# Patient Record
Sex: Female | Born: 1960 | Race: Black or African American | Hispanic: No | Marital: Married | Smoking: Never smoker
Health system: Southern US, Community
[De-identification: ages and names within clinical notes are randomized; demographics above are authoritative.]

## PROBLEM LIST (undated history)

## (undated) ENCOUNTER — Ambulatory Visit: Admission: EM | Payer: BC Managed Care – PPO | Source: Home / Self Care

## (undated) DIAGNOSIS — I10 Essential (primary) hypertension: Secondary | ICD-10-CM

## (undated) DIAGNOSIS — E785 Hyperlipidemia, unspecified: Secondary | ICD-10-CM

## (undated) HISTORY — PX: ABDOMINAL HYSTERECTOMY: SHX81

## (undated) HISTORY — DX: Hyperlipidemia, unspecified: E78.5

---

## 2004-10-13 ENCOUNTER — Emergency Department: Payer: Self-pay | Admitting: Unknown Physician Specialty

## 2005-08-08 ENCOUNTER — Ambulatory Visit: Payer: Self-pay | Admitting: Internal Medicine

## 2005-08-29 ENCOUNTER — Ambulatory Visit: Payer: Self-pay | Admitting: Internal Medicine

## 2005-09-29 ENCOUNTER — Ambulatory Visit: Payer: Self-pay | Admitting: Internal Medicine

## 2005-10-27 ENCOUNTER — Ambulatory Visit: Payer: Self-pay | Admitting: Internal Medicine

## 2005-11-27 ENCOUNTER — Ambulatory Visit: Payer: Self-pay | Admitting: Internal Medicine

## 2008-06-23 ENCOUNTER — Ambulatory Visit: Payer: Self-pay | Admitting: Otolaryngology

## 2009-03-29 ENCOUNTER — Ambulatory Visit: Payer: Self-pay | Admitting: Internal Medicine

## 2009-04-13 ENCOUNTER — Ambulatory Visit: Payer: Self-pay | Admitting: Internal Medicine

## 2009-04-29 ENCOUNTER — Ambulatory Visit: Payer: Self-pay | Admitting: Internal Medicine

## 2011-04-30 ENCOUNTER — Emergency Department: Payer: Self-pay | Admitting: Emergency Medicine

## 2011-09-09 ENCOUNTER — Emergency Department: Payer: Self-pay | Admitting: Emergency Medicine

## 2019-02-24 ENCOUNTER — Emergency Department: Payer: No Typology Code available for payment source

## 2019-02-24 ENCOUNTER — Emergency Department
Admission: EM | Admit: 2019-02-24 | Discharge: 2019-02-24 | Disposition: A | Discharge status: Home / Self Care | Payer: No Typology Code available for payment source | Attending: Student in an Organized Health Care Education/Training Program | Admitting: Student in an Organized Health Care Education/Training Program

## 2019-02-24 ENCOUNTER — Other Ambulatory Visit: Payer: Self-pay

## 2019-02-24 DIAGNOSIS — M25462 Effusion, left knee: Secondary | ICD-10-CM

## 2019-02-24 DIAGNOSIS — M1712 Unilateral primary osteoarthritis, left knee: Secondary | ICD-10-CM | POA: Diagnosis not present

## 2019-02-24 DIAGNOSIS — M25562 Pain in left knee: Secondary | ICD-10-CM

## 2019-02-24 MED ORDER — HYDROCODONE-ACETAMINOPHEN 5-325 MG PO TABS
1.0000 | ORAL_TABLET | Freq: Once | ORAL | Status: AC
  Administered 2019-02-24: 1 via ORAL
  Filled 2019-02-24: qty 1

## 2019-02-24 MED ORDER — KETOROLAC TROMETHAMINE 10 MG PO TABS: 10.0000 mg | ORAL_TABLET | Freq: Four times a day (QID) | ORAL | 0 refills | Status: DC | PRN

## 2019-02-24 MED ORDER — KETOROLAC TROMETHAMINE 30 MG/ML IJ SOLN
30.0000 mg | Freq: Once | INTRAMUSCULAR | Status: AC
  Administered 2019-02-24: 30 mg via INTRAMUSCULAR
  Filled 2019-02-24: qty 1

## 2019-02-24 NOTE — ED Notes (Signed)
Pt to xray via wheelchair

## 2019-02-24 NOTE — Discharge Instructions (Signed)
Your x-ray does not show any broken bones.  It does show you may have some swelling inside the joint, which could mean that you injured one of the ligaments or the cartilage.  You also have some arthritis.  Please ice and elevate knee while at home.  Use knee brace or knee immobilizer and use crutches.  You can take Toradol for pain and inflammation.  Do not take any additional Advil with Toradol.  Please call Dr. Roland Rack on Monday for an appointment.

## 2019-02-24 NOTE — ED Notes (Signed)
Pt c/o L lower extremity pain worsening x 1 week, per triage note pt fell over 1 month ago, pt c/o worsening pain x 1 week. Pt ambulatory with steady gait from lobby to treatment room, NAD noted at this time.

## 2019-02-24 NOTE — ED Provider Notes (Signed)
[facility] Emergency Department Provider Note  ____________________________________________  Time seen: Approximately 7:48 AM  I have reviewed the triage vital signs and the nursing notes.   HISTORY  Chief Complaint Knee Pain    HPI Caitlin BattyCatherine Broberg is a 58 y.o. female that presents to the emergency department for evaluation of knee pain following knee injury 1 month ago.  Patient was at work when she fell landing on her left knee.  She has had pain on and off since then.  She has been wearing a brace, which helps some.  She is also been taking Advil, which does not help very much.  She was not evaluated post fall.  No additional injuries.  She knows she has arthritis in this knee as well.   No past medical history on file.  There are no active problems to display for this patient.   Prior to Admission medications   Medication Sig Start Date End Date Taking? Authorizing Provider  ketorolac (TORADOL) 10 MG tablet Take 1 tablet (10 mg total) by mouth every 6 (six) hours as needed. 02/24/19   Enid DerryWagner, Brendi Mccarroll, PA-C    Allergies Patient has no known allergies.  No family history on file.  Social History Social History   Tobacco Use  . Smoking status: Not on file  Substance Use Topics  . Alcohol use: Not on file  . Drug use: Not on file     Review of Systems  Constitutional: No fever/chills Gastrointestinal: No nausea, no vomiting.  Musculoskeletal: Positive for knee pain. Skin: Negative for rash, abrasions, lacerations, ecchymosis. Neurological: Negative for numbness or tingling   ____________________________________________   PHYSICAL EXAM:  VITAL SIGNS: ED Triage Vitals  Enc Vitals Group     BP 02/24/19 0345 130/82     Pulse Rate 02/24/19 0345 (!) 59     Resp 02/24/19 0345 17     Temp 02/24/19 0345 98 F (36.7 C)     Temp Source 02/24/19 0345 Oral     SpO2 02/24/19 0345 98 %     Weight --      Height 02/24/19 0342 5' 10.5" (1.791  m)     Head Circumference --      Peak Flow --      Pain Score 02/24/19 0342 8     Pain Loc --      Pain Edu? --      Excl. in GC? --      Constitutional: Alert and oriented. Well appearing and in no acute distress. Eyes: Conjunctivae are normal. PERRL. EOMI. Head: Atraumatic. ENT:      Ears:      Nose: No congestion/rhinnorhea.      Mouth/Throat: Mucous membranes are moist.  Neck: No stridor.  Cardiovascular: Normal rate, regular rhythm.  Good peripheral circulation. Respiratory: Normal respiratory effort without tachypnea or retractions. Lungs CTAB. Good air entry to the bases with no decreased or absent breath sounds. Musculoskeletal: Full range of motion to all extremities. No gross deformities appreciated.  Knee brace and in place and removed for exam.  Mild tenderness to palpation to anterior knee.  Full range of motion of knee with minimal pain.  No visible swelling or erythema. Neurologic:  Normal speech and language. No gross focal neurologic deficits are appreciated.  Skin:  Skin is warm, dry and intact. No rash noted. Psychiatric: Mood and affect are normal. Speech and behavior are normal. Patient exhibits appropriate insight and judgement.   ____________________________________________   LABS (all labs  ordered are listed, but only abnormal results are displayed)  Labs Reviewed - No data to display ____________________________________________  EKG   ____________________________________________  RADIOLOGY Robinette Haines, personally viewed and evaluated these images (plain radiographs) as part of my medical decision making, as well as reviewing the written report by the radiologist.  Dg Knee Complete 4 Views Left  Result Date: 02/24/2019 CLINICAL DATA:  Fall approximately 1 month ago. Increasing knee pain. EXAM: LEFT KNEE - COMPLETE 4+ VIEW COMPARISON:  None. FINDINGS: Osseous alignment is normal. No fracture line or displaced fracture fragment seen. No acute or  suspicious osseous lesion. Probable joint effusion within the suprapatellar bursa. Mild spurring at the medial joint line. Medial and lateral compartment joint spaces appear well preserved in height. No significant degenerative change at the patellofemoral compartment. IMPRESSION: 1. No osseous fracture or dislocation. 2. Probable joint effusion. 3. Minimal degenerative spurring at the medial joint line. Electronically Signed   By: Franki Cabot M.D.   On: 02/24/2019 04:31    ____________________________________________    PROCEDURES  Procedure(s) performed:    Procedures    Medications  ketorolac (TORADOL) 30 MG/ML injection 30 mg (has no administration in time range)  HYDROcodone-acetaminophen (NORCO/VICODIN) 5-325 MG per tablet 1 tablet (has no administration in time range)     ____________________________________________   INITIAL IMPRESSION / ASSESSMENT AND PLAN / ED COURSE  Pertinent labs & imaging results that were available during my care of the patient were reviewed by me and considered in my medical decision making (see chart for details).  Review of the [place] CSRS was performed in accordance of the [place] prior to dispensing any controlled drugs.    Patient presented to emergency department for evaluation of knee pain following the injury 1 month ago.  Vital signs and exam are reassuring.  X-ray consistent with possible joint effusion and osteoarthritis.  IM Toradol and Norco was given for pain.  Knee immobilizer was placed and crutches were given.  Patient will be discharged home with prescriptions for Toradol. Patient is to follow up with orthopedics as directed.  Referral was given to Dr. Roland Rack.  Patient is given ED precautions to return to the ED for any worsening or new symptoms.  Caitlin Day was evaluated in Emergency Department on 02/24/2019 for the symptoms described in the history of present illness. She was evaluated in the context of the global COVID-19 pandemic,  which necessitated consideration that the patient might be at risk for infection with the SARS-CoV-2 virus that causes COVID-19. Institutional protocols and algorithms that pertain to the evaluation of patients at risk for COVID-19 are in a state of rapid change based on information released by regulatory bodies including the CDC and federal and state organizations. These policies and algorithms were followed during the patient's care in the ED.     ____________________________________________  FINAL CLINICAL IMPRESSION(S) / ED DIAGNOSES  Final diagnoses:  Effusion of left knee  Osteoarthritis of left knee, unspecified osteoarthritis type  Acute pain of left knee      NEW MEDICATIONS STARTED DURING THIS VISIT:  ED Discharge Orders         Ordered    ketorolac (TORADOL) 10 MG tablet  Every 6 hours PRN     02/24/19 0752              This chart was dictated using voice recognition software/Dragon. Despite best efforts to proofread, errors can occur which can change the meaning. Any change was purely unintentional.  Enid DerryWagner, Jearldean Gutt, PA-C 02/24/19 1541    Willy Eddyobinson, Patrick, MD 02/24/19 1600

## 2019-02-24 NOTE — ED Notes (Signed)
Pt requesting to file [facility] for fall at work. Pt works for [employer]. Pt states initial injury happened on December 19, 2018. No WC profile found by this RN.

## 2019-02-24 NOTE — ED Notes (Signed)
NAD noted at time of D/C. Pt denies questions or concerns. Pt ambulatory to the lobby at this time.  Pt refused wheelchair to the lobby at this time.  

## 2019-02-24 NOTE — ED Notes (Signed)
Attempted to contact supervisor multiple times prior to administration of medication, called [phone] from card patient provided, called [phone] from website without success. Conferred with Theadora Rama, RN and Levada Dy, RN, no UDS done since accident happened 4/22 and unable to get in contact with supervisors.

## 2019-02-24 NOTE — ED Triage Notes (Signed)
Patient reports fell approximately a month ago but did not seek medical treatment.  Over past few days left knee pain that is getting worse.

## 2019-02-28 ENCOUNTER — Ambulatory Visit
Admission: EM | Admit: 2019-02-28 | Discharge: 2019-02-28 | Disposition: A | Discharge status: Home / Self Care | Payer: No Typology Code available for payment source | Attending: Family Medicine | Admitting: Family Medicine

## 2019-02-28 ENCOUNTER — Other Ambulatory Visit: Payer: Self-pay

## 2019-02-28 DIAGNOSIS — G8929 Other chronic pain: Secondary | ICD-10-CM | POA: Diagnosis not present

## 2019-02-28 DIAGNOSIS — M25562 Pain in left knee: Secondary | ICD-10-CM | POA: Diagnosis not present

## 2019-02-28 HISTORY — DX: Essential (primary) hypertension: I10

## 2019-02-28 MED ORDER — TRAMADOL HCL 50 MG PO TABS: 50.0000 mg | ORAL_TABLET | Freq: Three times a day (TID) | ORAL | 0 refills | Status: DC | PRN

## 2019-02-28 MED ORDER — ONDANSETRON 4 MG PO TBDP: 4.0000 mg | ORAL_TABLET | Freq: Once | ORAL | Status: DC

## 2019-02-28 NOTE — Discharge Instructions (Signed)
Rest, ice, elevation.  Medication as needed.  If persists, see [facility] ([address], [place], [place])

## 2019-02-28 NOTE — ED Triage Notes (Signed)
Pt here for fall that happened 2 months ago but didn't see a provider. Stated it got better and then got worse. Has been trying a heating pad. Landed on her left side and now left knee pain and has a brace on her left knee currently. Did go to the ER and was given a shot and some medication that did help.

## 2019-02-28 NOTE — ED Provider Notes (Signed)
[facility]    CSN: [id number] Arrival date & time: 02/28/19  3267  History   Chief Complaint Chief Complaint  Patient presents with  . Work Related Injury    Appt   HPI  58 year old female presents with the above complaint.  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  Patient states that she was injured on the job approximately 2 months ago.  She states that the injury occurred at the end of April.  Patient states that she fell on a piece of loose carpet.  She landed left knee.  Patient did not seek treatment immediately.  Patient was recently seen on 6/28 in the ER.  X-ray was obtained and was negative for fracture.  There was a mild effusion.  Mild osteoarthritis.  Patient was given medication and states that it has not improved her pain.  Pain is currently 7/10 in severity.  She has tried over-the-counter ibuprofen, prescription Toradol, Advil, topical medication without resolution.  Exacerbated by prolonged standing.  No relieving factors.  No other associated symptoms.  No other complaints.  PMH, Surgical Hx, Family Hx, Social History reviewed and updated as below.  Past Medical History:  Diagnosis Date  . Hypertension   Obesity Hypothyroidism Gout Hyperlipidemia  Past Surgical History:  Procedure Laterality Date  . ABDOMINAL HYSTERECTOMY     OB History   No obstetric history on file.    Home Medications    Prior to Admission medications   Medication Sig Start Date End Date Taking? Authorizing Provider  allopurinol (ZYLOPRIM) 100 MG tablet Take 100 mg by mouth daily. 12/21/18  Yes [provider]  estradiol (ESTRACE) 0.5 MG tablet Take 0.5 mg by mouth daily. 01/22/19  Yes [provider]  lisinopril-hydrochlorothiazide (ZESTORETIC) 20-25 MG tablet Take 1 tablet by mouth daily. 12/21/18  Yes [provider]  pravastatin (PRAVACHOL) 40 MG tablet Take by mouth. 06/04/18  Yes [provider]  SYNTHROID 150 MCG tablet Take 150 mcg by  mouth every morning. 02/05/19  Yes [provider]  traMADol (ULTRAM) 50 MG tablet Take 1 tablet (50 mg total) by mouth every 8 (eight) hours as needed. 02/28/19   Coral Spikes, DO    Family History Family History  Problem Relation Age of Onset  . Dementia Father     Social History Social History   Tobacco Use  . Smoking status: Never Smoker  . Smokeless tobacco: Never Used  Substance Use Topics  . Alcohol use: Never    Frequency: Never  . Drug use: Not on file     Allergies   Patient has no known allergies.   Review of Systems Review of Systems  Constitutional: Negative.   Musculoskeletal:       Left knee pain.   Physical Exam Triage Vital Signs ED Triage Vitals  Enc Vitals Group     BP 02/28/19 1006 (!) 135/92     Pulse Rate 02/28/19 1006 66     Resp 02/28/19 1006 18     Temp 02/28/19 1006 98.1 F (36.7 C)     Temp Source 02/28/19 1006 Oral     SpO2 02/28/19 1006 97 %     Weight 02/28/19 1007 230 lb (104.3 kg)     Height 02/28/19 1007 5' 10.5" (1.791 m)     Head Circumference --      Peak Flow --      Pain Score 02/28/19 1007 7     Pain Loc --  Pain Edu? --      Excl. in GC? --    Updated Vital Signs BP (!) 135/92 (BP Location: Right Arm)   Pulse 66   Temp 98.1 F (36.7 C) (Oral)   Resp 18   Ht 5' 10.5" (1.791 m)   Wt 104.3 kg   SpO2 97%   BMI 32.54 kg/m   Visual Acuity Right Eye Distance:   Left Eye Distance:   Bilateral Distance:    Right Eye Near:   Left Eye Near:    Bilateral Near:     Physical Exam Vitals signs and nursing note reviewed.  Constitutional:      General: She is not in acute distress.    Appearance: Normal appearance. She is obese.  HENT:     Head: Normocephalic and atraumatic.  Eyes:     General:        Right eye: No discharge.        Left eye: No discharge.     Conjunctiva/sclera: Conjunctivae normal.  Pulmonary:     Effort: Pulmonary effort is normal. No respiratory distress.  Musculoskeletal:      Comments: Left knee -normal inspection.  No joint line tenderness.  Ligaments intact.  Skin:    General: Skin is warm.     Findings: No bruising.  Neurological:     Mental Status: She is alert.  Psychiatric:        Mood and Affect: Mood normal.        Behavior: Behavior normal.    UC Treatments / Results  Labs (all labs ordered are listed, but only abnormal results are displayed) Labs Reviewed - No data to display  EKG   Radiology No results found.  Procedures Procedures (including critical care time)  Medications Ordered in UC Medications - No data to display  Initial Impression / Assessment and Plan / UC Course  I have reviewed the triage vital signs and the nursing notes.  Pertinent labs & imaging results that were available during my care of the patient were reviewed by me and considered in my medical decision making (see chart for details).    58 year old female presents with chronic pain in her left knee after suffering a fall at work.  Workmen's Comp. form filled out.  Work note given.  Tramadol as needed.  Advised to see orthopedics.  Final Clinical Impressions(s) / UC Diagnoses   Final diagnoses:  Chronic pain of left knee     Discharge Instructions     Rest, ice, elevation.  Medication as needed.  If persists, see [facility] ([address], [place], [place])   ED Prescriptions    Medication Sig Dispense Auth. Provider   traMADol (ULTRAM) 50 MG tablet Take 1 tablet (50 mg total) by mouth every 8 (eight) hours as needed. 15 tablet Tommie Samsook, Rayn Enderson G, DO     Controlled Substance Prescriptions [place] Controlled Substance Registry consulted? Not Applicable   Tommie SamsCook, Kellan Boehlke G, DO 02/28/19 1241

## 2021-04-06 ENCOUNTER — Encounter: Payer: Self-pay | Admitting: Endocrinology

## 2021-10-11 DIAGNOSIS — E7800 Pure hypercholesterolemia, unspecified: Secondary | ICD-10-CM | POA: Diagnosis not present

## 2021-10-11 DIAGNOSIS — E790 Hyperuricemia without signs of inflammatory arthritis and tophaceous disease: Secondary | ICD-10-CM | POA: Diagnosis not present

## 2021-10-11 DIAGNOSIS — E039 Hypothyroidism, unspecified: Secondary | ICD-10-CM | POA: Diagnosis not present

## 2021-10-11 DIAGNOSIS — E063 Autoimmune thyroiditis: Secondary | ICD-10-CM | POA: Diagnosis not present

## 2021-10-11 DIAGNOSIS — R7302 Impaired glucose tolerance (oral): Secondary | ICD-10-CM | POA: Diagnosis not present

## 2021-10-18 DIAGNOSIS — E669 Obesity, unspecified: Secondary | ICD-10-CM | POA: Diagnosis not present

## 2021-10-18 DIAGNOSIS — E559 Vitamin D deficiency, unspecified: Secondary | ICD-10-CM | POA: Diagnosis not present

## 2021-10-18 DIAGNOSIS — I10 Essential (primary) hypertension: Secondary | ICD-10-CM | POA: Diagnosis not present

## 2021-10-18 DIAGNOSIS — E7800 Pure hypercholesterolemia, unspecified: Secondary | ICD-10-CM | POA: Diagnosis not present

## 2021-10-18 DIAGNOSIS — K760 Fatty (change of) liver, not elsewhere classified: Secondary | ICD-10-CM | POA: Diagnosis not present

## 2021-10-18 DIAGNOSIS — Z6833 Body mass index (BMI) 33.0-33.9, adult: Secondary | ICD-10-CM | POA: Diagnosis not present

## 2021-10-18 DIAGNOSIS — Z7189 Other specified counseling: Secondary | ICD-10-CM | POA: Diagnosis not present

## 2021-10-18 DIAGNOSIS — E039 Hypothyroidism, unspecified: Secondary | ICD-10-CM | POA: Diagnosis not present

## 2021-11-27 ENCOUNTER — Other Ambulatory Visit: Payer: Self-pay

## 2021-11-27 ENCOUNTER — Emergency Department
Admission: EM | Admit: 2021-11-27 | Discharge: 2021-11-27 | Disposition: A | Discharge status: Home / Self Care | Payer: No Typology Code available for payment source | Attending: Emergency Medicine | Admitting: Emergency Medicine

## 2021-11-27 ENCOUNTER — Encounter: Payer: Self-pay | Admitting: Intensive Care

## 2021-11-27 DIAGNOSIS — M25561 Pain in right knee: Secondary | ICD-10-CM | POA: Insufficient documentation

## 2021-11-27 DIAGNOSIS — W010XXA Fall on same level from slipping, tripping and stumbling without subsequent striking against object, initial encounter: Secondary | ICD-10-CM | POA: Insufficient documentation

## 2021-11-27 NOTE — ED Triage Notes (Signed)
Patient reports when at work yesterday she fell her on right knee. Now experiencing right knee discomfort. Patient wants to file workmans comp ?

## 2021-11-27 NOTE — Discharge Instructions (Addendum)
Please use ibuprofen (Motrin) up to 800 mg every 8 hours, naproxen (Naprosyn) up to 500 mg every 12 hours, and/or acetaminophen (Tylenol) up to 4 g/day for any continued pain 

## 2021-11-27 NOTE — ED Provider Notes (Signed)
? ?[facility] ?Provider Note ? ? Event Date/Time  ? First MD Initiated Contact with Patient 11/27/21 1657   ?  (approximate) ?History  ?Fall ? ?HPI ?Caitlin Day is a 61 y.o. female with a history of obesity who presents for right anterior knee pain after a mechanical fall from standing 24 hours prior to arrival.  Patient states that she was pulled forward by her vest and landed on her hands and knees on a tile floor.  Patient states that she has been able to bear weight and walk since this time but does endorse significant anterior pain just behind her kneecap.  Patient states that she can bend it to a full range of motion with limited pain and endorses occasional shooting pains down the anterior shin. ?Physical Exam  ?Triage Vital Signs: ?ED Triage Vitals  ?Enc Vitals Group  ?   BP 11/27/21 1651 134/85  ?   Pulse Rate 11/27/21 1651 83  ?   Resp 11/27/21 1651 18  ?   Temp 11/27/21 1651 98.6 ?F (37 ?C)  ?   Temp Source 11/27/21 1651 Oral  ?   SpO2 11/27/21 1651 97 %  ?   Weight 11/27/21 1651 230 lb (104.3 kg)  ?   Height 11/27/21 1651 5' 10.5" (1.791 m)  ?   Head Circumference --   ?   Peak Flow --   ?   Pain Score 11/27/21 1651 8  ?   Pain Loc --   ?   Pain Edu? --   ?   Excl. in GC? --   ? ?Most recent vital signs: ?Vitals:  ? 11/27/21 1651  ?BP: 134/85  ?Pulse: 83  ?Resp: 18  ?Temp: 98.6 ?F (37 ?C)  ?SpO2: 97%  ? ?General: Awake, oriented x4. ?CV:  Good peripheral perfusion.  ?Resp:  Normal effort.  ?Abd:  No distention.  ?Other:  Middle-aged overweight African-American female laying in bed in no distress.  Knee brace to the right knee with full range of motion without difficulty but with slight pain.  No joint laxity on exam.  Sensation intact ?ED Results / Procedures / Treatments  ? ?Procedures ?MEDICATIONS ORDERED IN ED: ?Medications - No data to display ?IMPRESSION / MDM / ASSESSMENT AND PLAN / ED COURSE  ?I reviewed the triage vital signs and the nursing notes. ?             ?                ? ?61 year old female presents for right knee pain after a fall 1 day ago.  Patient is ambulatory and can range this knee to the full range of motion with only minimal pain and therefore does not require any imaging at this time.  Patient encouraged and given proper follow-up with orthopedics as necessary if pain does not improve within the next 1 week. ?Given history, exam and workup I have low suspicion for fracture, dislocation, significant ligamentous injury, septic arthritis, gout flare, new autoimmune arthropathy, or gonococcal arthropathy. ? ?Interventions: ?As needed OTC analgesia ?Disposition: Discharge home with strict return precautions and instructions for prompt primary care follow up in the next week. ? ?  ?FINAL CLINICAL IMPRESSION(S) / ED DIAGNOSES  ? ?Final diagnoses:  ?Acute pain of right knee  ? ?Rx / DC Orders  ? ?ED Discharge Orders   ? ? None  ? ?  ? ?Note:  This document was prepared using Conservation officer, historic buildings and may  include unintentional dictation errors. ?  ?Merwyn Katos, MD ?11/27/21 1751 ? ?

## 2021-11-29 ENCOUNTER — Other Ambulatory Visit: Payer: Self-pay | Admitting: Physician Assistant

## 2021-11-29 ENCOUNTER — Ambulatory Visit
Admission: RE | Admit: 2021-11-29 | Discharge: 2021-11-29 | Disposition: A | Discharge status: Home / Self Care | Payer: No Typology Code available for payment source | Source: Ambulatory Visit | Attending: Physician Assistant | Admitting: Physician Assistant

## 2021-11-29 ENCOUNTER — Other Ambulatory Visit: Payer: Self-pay

## 2021-11-29 DIAGNOSIS — M1711 Unilateral primary osteoarthritis, right knee: Secondary | ICD-10-CM | POA: Insufficient documentation

## 2021-11-29 DIAGNOSIS — M25561 Pain in right knee: Secondary | ICD-10-CM | POA: Diagnosis not present

## 2021-11-29 DIAGNOSIS — Y990 Civilian activity done for income or pay: Secondary | ICD-10-CM | POA: Insufficient documentation

## 2021-11-29 DIAGNOSIS — W1830XA Fall on same level, unspecified, initial encounter: Secondary | ICD-10-CM | POA: Insufficient documentation

## 2021-11-29 DIAGNOSIS — S8991XA Unspecified injury of right lower leg, initial encounter: Secondary | ICD-10-CM | POA: Diagnosis present

## 2022-01-11 DIAGNOSIS — E063 Autoimmune thyroiditis: Secondary | ICD-10-CM | POA: Diagnosis not present

## 2022-01-11 DIAGNOSIS — E7800 Pure hypercholesterolemia, unspecified: Secondary | ICD-10-CM | POA: Diagnosis not present

## 2022-01-11 DIAGNOSIS — E039 Hypothyroidism, unspecified: Secondary | ICD-10-CM | POA: Diagnosis not present

## 2022-01-11 DIAGNOSIS — I10 Essential (primary) hypertension: Secondary | ICD-10-CM | POA: Diagnosis not present

## 2022-01-11 DIAGNOSIS — R7302 Impaired glucose tolerance (oral): Secondary | ICD-10-CM | POA: Diagnosis not present

## 2022-01-18 DIAGNOSIS — E7800 Pure hypercholesterolemia, unspecified: Secondary | ICD-10-CM | POA: Diagnosis not present

## 2022-01-18 DIAGNOSIS — I10 Essential (primary) hypertension: Secondary | ICD-10-CM | POA: Diagnosis not present

## 2022-01-18 DIAGNOSIS — Z6834 Body mass index (BMI) 34.0-34.9, adult: Secondary | ICD-10-CM | POA: Diagnosis not present

## 2022-01-18 DIAGNOSIS — E669 Obesity, unspecified: Secondary | ICD-10-CM | POA: Diagnosis not present

## 2022-01-18 DIAGNOSIS — E039 Hypothyroidism, unspecified: Secondary | ICD-10-CM | POA: Diagnosis not present

## 2022-04-04 DIAGNOSIS — E7800 Pure hypercholesterolemia, unspecified: Secondary | ICD-10-CM | POA: Diagnosis not present

## 2022-04-04 DIAGNOSIS — I10 Essential (primary) hypertension: Secondary | ICD-10-CM | POA: Diagnosis not present

## 2022-04-04 DIAGNOSIS — Z6834 Body mass index (BMI) 34.0-34.9, adult: Secondary | ICD-10-CM | POA: Diagnosis not present

## 2022-04-04 DIAGNOSIS — Z1331 Encounter for screening for depression: Secondary | ICD-10-CM | POA: Diagnosis not present

## 2022-04-04 DIAGNOSIS — E790 Hyperuricemia without signs of inflammatory arthritis and tophaceous disease: Secondary | ICD-10-CM | POA: Diagnosis not present

## 2022-04-04 DIAGNOSIS — Z136 Encounter for screening for cardiovascular disorders: Secondary | ICD-10-CM | POA: Diagnosis not present

## 2022-04-04 DIAGNOSIS — Z23 Encounter for immunization: Secondary | ICD-10-CM | POA: Diagnosis not present

## 2022-04-04 DIAGNOSIS — Z0001 Encounter for general adult medical examination with abnormal findings: Secondary | ICD-10-CM | POA: Diagnosis not present

## 2022-04-22 DIAGNOSIS — Z23 Encounter for immunization: Secondary | ICD-10-CM | POA: Diagnosis not present

## 2022-06-13 DIAGNOSIS — Z1231 Encounter for screening mammogram for malignant neoplasm of breast: Secondary | ICD-10-CM | POA: Diagnosis not present

## 2022-06-13 LAB — HM MAMMOGRAPHY

## 2022-07-20 IMAGING — CR DG KNEE COMPLETE 4+V*R*
4 series · 4 of 4 positions shown · non-contrast
Comparison: None.

CLINICAL DATA: Right knee pain

EXAM:
RIGHT KNEE - COMPLETE 4+ VIEW

[knee ap]
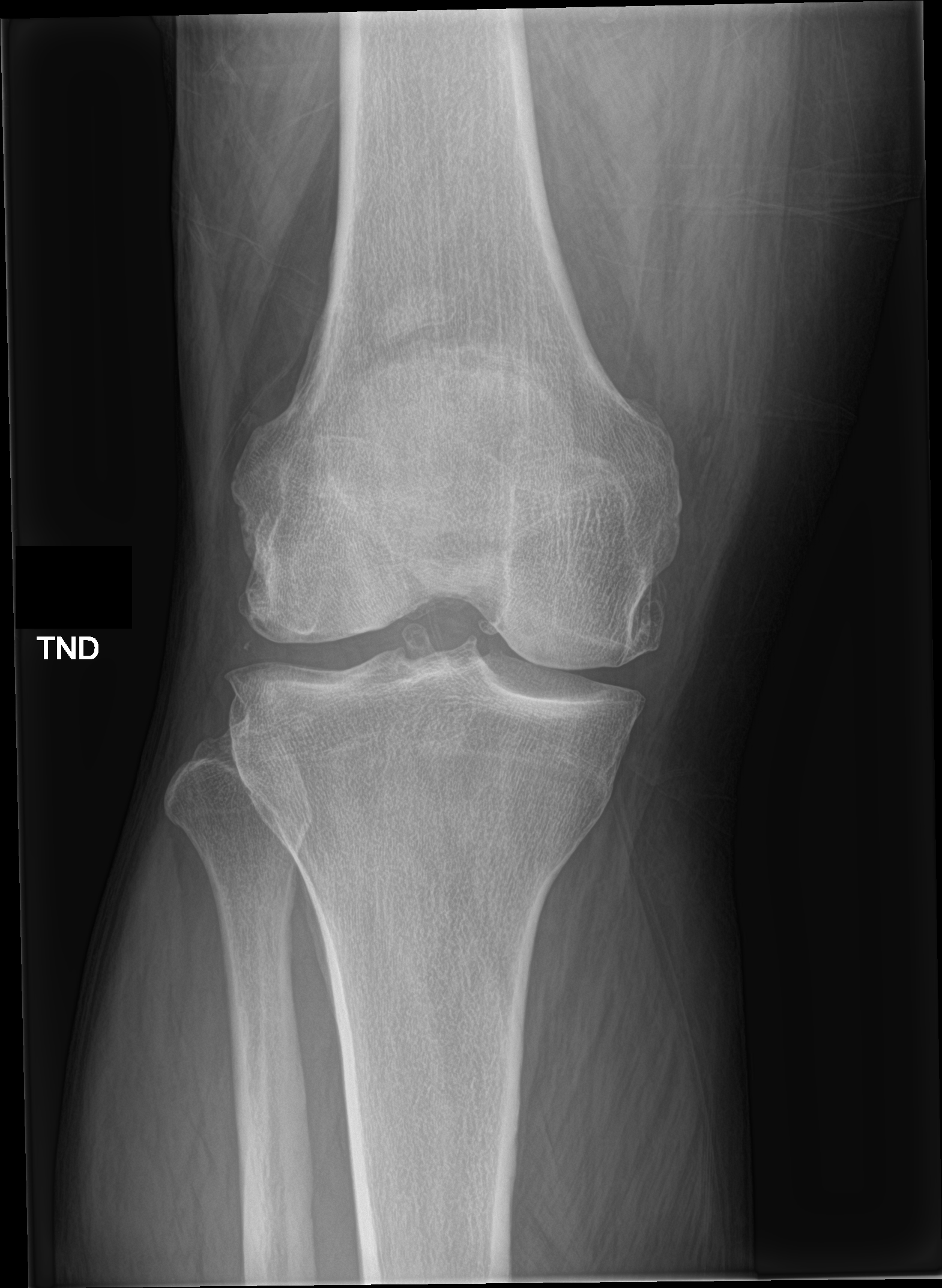

[knee obl (1 of 2)]
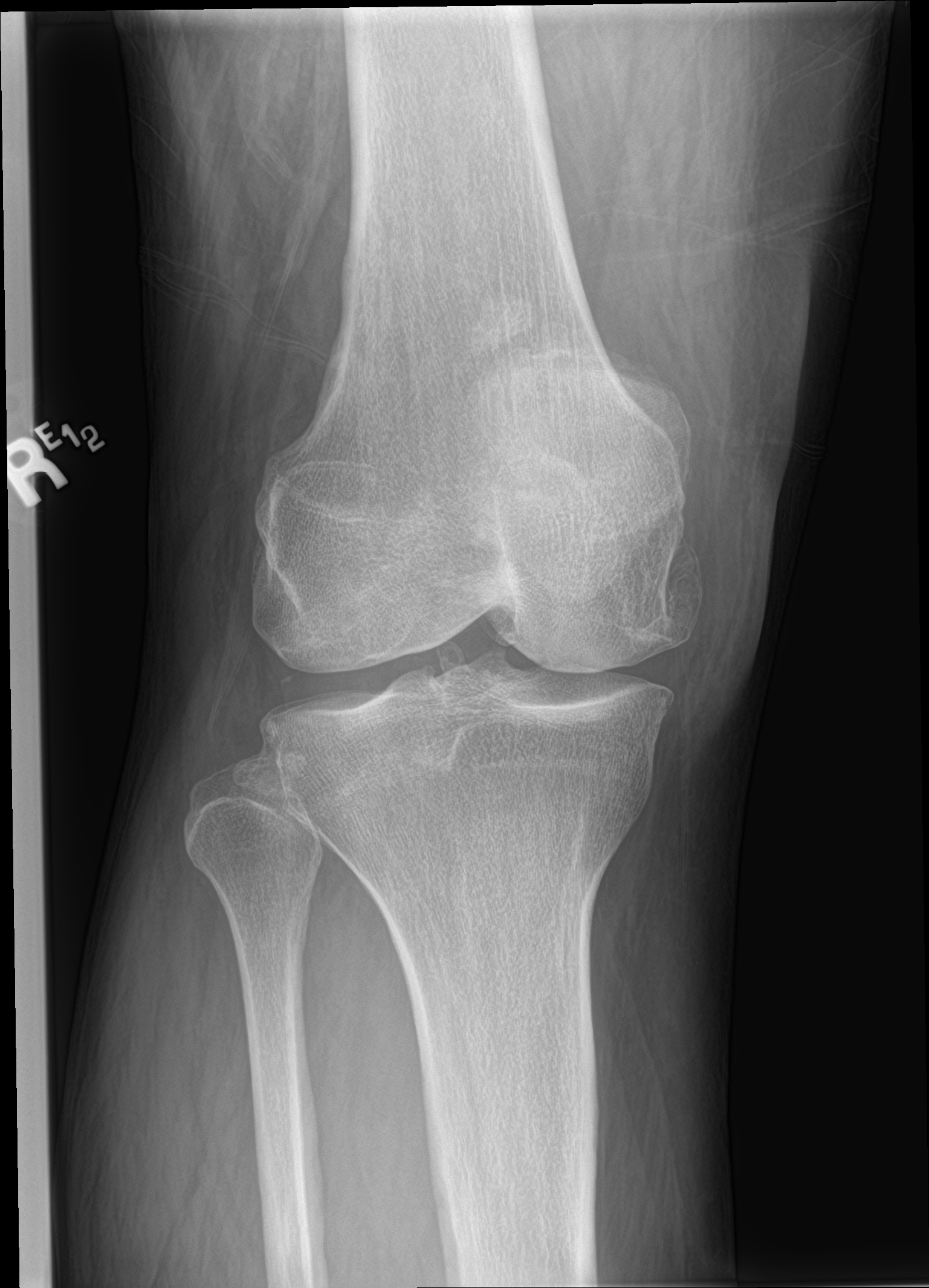

[knee obl (2 of 2)]
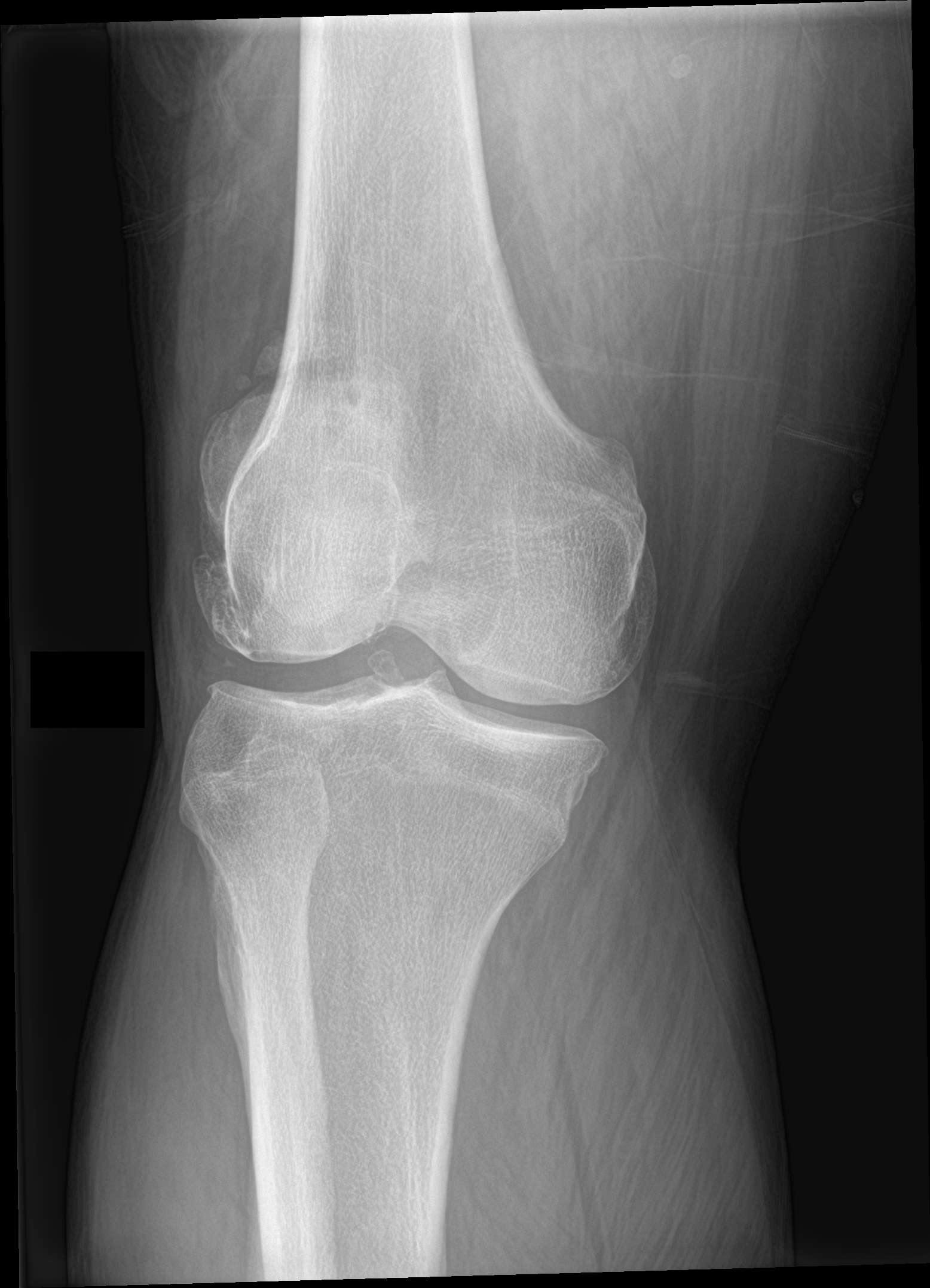

[knee lat]
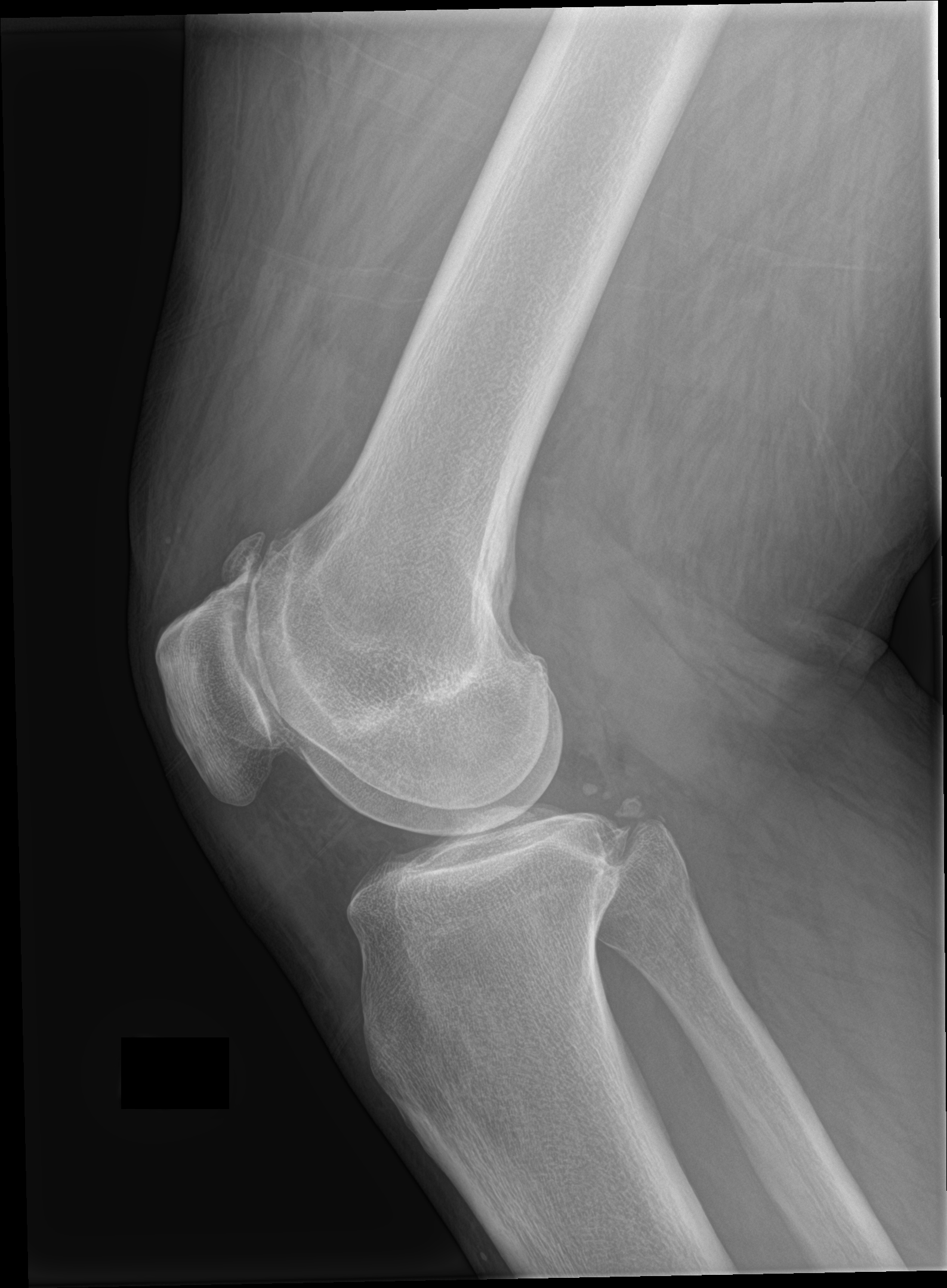

[4 of 4 positions shown; findings below may reference images not displayed]

FINDINGS: No acute fracture or malalignment. No large joint effusion.
Mild-moderate tricompartmental osteoarthritis which appears most
pronounced in the patellofemoral compartment. Small loose bodies at
the posterior joint line. No soft tissue abnormality.
IMPRESSION: Mild-moderate tricompartmental osteoarthritis, most pronounced in
the patellofemoral compartment.

## 2022-07-25 DIAGNOSIS — E063 Autoimmune thyroiditis: Secondary | ICD-10-CM | POA: Diagnosis not present

## 2022-07-25 DIAGNOSIS — I10 Essential (primary) hypertension: Secondary | ICD-10-CM | POA: Diagnosis not present

## 2022-07-25 DIAGNOSIS — E039 Hypothyroidism, unspecified: Secondary | ICD-10-CM | POA: Diagnosis not present

## 2022-07-25 DIAGNOSIS — E7800 Pure hypercholesterolemia, unspecified: Secondary | ICD-10-CM | POA: Diagnosis not present

## 2022-08-01 DIAGNOSIS — I10 Essential (primary) hypertension: Secondary | ICD-10-CM | POA: Diagnosis not present

## 2022-08-01 DIAGNOSIS — E669 Obesity, unspecified: Secondary | ICD-10-CM | POA: Diagnosis not present

## 2022-08-01 DIAGNOSIS — E559 Vitamin D deficiency, unspecified: Secondary | ICD-10-CM | POA: Diagnosis not present

## 2022-08-01 DIAGNOSIS — Z6833 Body mass index (BMI) 33.0-33.9, adult: Secondary | ICD-10-CM | POA: Diagnosis not present

## 2022-09-06 DIAGNOSIS — Z6834 Body mass index (BMI) 34.0-34.9, adult: Secondary | ICD-10-CM | POA: Diagnosis not present

## 2022-09-06 DIAGNOSIS — E669 Obesity, unspecified: Secondary | ICD-10-CM | POA: Diagnosis not present

## 2022-09-06 DIAGNOSIS — Z13820 Encounter for screening for osteoporosis: Secondary | ICD-10-CM | POA: Diagnosis not present

## 2022-09-06 DIAGNOSIS — E559 Vitamin D deficiency, unspecified: Secondary | ICD-10-CM | POA: Diagnosis not present

## 2022-09-06 DIAGNOSIS — R7302 Impaired glucose tolerance (oral): Secondary | ICD-10-CM | POA: Diagnosis not present

## 2022-09-06 DIAGNOSIS — E790 Hyperuricemia without signs of inflammatory arthritis and tophaceous disease: Secondary | ICD-10-CM | POA: Diagnosis not present

## 2022-09-06 DIAGNOSIS — I10 Essential (primary) hypertension: Secondary | ICD-10-CM | POA: Diagnosis not present

## 2023-01-02 DIAGNOSIS — I10 Essential (primary) hypertension: Secondary | ICD-10-CM | POA: Diagnosis not present

## 2023-01-02 DIAGNOSIS — R7302 Impaired glucose tolerance (oral): Secondary | ICD-10-CM | POA: Diagnosis not present

## 2023-01-02 DIAGNOSIS — E7800 Pure hypercholesterolemia, unspecified: Secondary | ICD-10-CM | POA: Diagnosis not present

## 2023-01-02 DIAGNOSIS — E063 Autoimmune thyroiditis: Secondary | ICD-10-CM | POA: Diagnosis not present

## 2023-01-02 DIAGNOSIS — E559 Vitamin D deficiency, unspecified: Secondary | ICD-10-CM | POA: Diagnosis not present

## 2023-01-09 DIAGNOSIS — E669 Obesity, unspecified: Secondary | ICD-10-CM | POA: Diagnosis not present

## 2023-01-09 DIAGNOSIS — R7302 Impaired glucose tolerance (oral): Secondary | ICD-10-CM | POA: Diagnosis not present

## 2023-01-09 DIAGNOSIS — E039 Hypothyroidism, unspecified: Secondary | ICD-10-CM | POA: Diagnosis not present

## 2023-01-09 DIAGNOSIS — Z6833 Body mass index (BMI) 33.0-33.9, adult: Secondary | ICD-10-CM | POA: Diagnosis not present

## 2023-01-09 DIAGNOSIS — K760 Fatty (change of) liver, not elsewhere classified: Secondary | ICD-10-CM | POA: Diagnosis not present

## 2023-01-09 DIAGNOSIS — E7800 Pure hypercholesterolemia, unspecified: Secondary | ICD-10-CM | POA: Diagnosis not present

## 2023-01-09 DIAGNOSIS — I10 Essential (primary) hypertension: Secondary | ICD-10-CM | POA: Diagnosis not present

## 2023-03-23 ENCOUNTER — Emergency Department (HOSPITAL_COMMUNITY): Payer: Worker's Compensation

## 2023-03-23 ENCOUNTER — Other Ambulatory Visit: Payer: Self-pay

## 2023-03-23 ENCOUNTER — Encounter (HOSPITAL_COMMUNITY): Payer: Self-pay

## 2023-03-23 ENCOUNTER — Emergency Department (HOSPITAL_COMMUNITY)
Admission: EM | Admit: 2023-03-23 | Discharge: 2023-03-23 | Disposition: A | Discharge status: Home / Self Care | Payer: Worker's Compensation | Attending: Emergency Medicine | Admitting: Emergency Medicine

## 2023-03-23 DIAGNOSIS — W19XXXA Unspecified fall, initial encounter: Secondary | ICD-10-CM

## 2023-03-23 DIAGNOSIS — Y990 Civilian activity done for income or pay: Secondary | ICD-10-CM | POA: Diagnosis not present

## 2023-03-23 DIAGNOSIS — S0993XA Unspecified injury of face, initial encounter: Secondary | ICD-10-CM | POA: Diagnosis present

## 2023-03-23 DIAGNOSIS — W01198A Fall on same level from slipping, tripping and stumbling with subsequent striking against other object, initial encounter: Secondary | ICD-10-CM | POA: Diagnosis not present

## 2023-03-23 DIAGNOSIS — Y9301 Activity, walking, marching and hiking: Secondary | ICD-10-CM | POA: Insufficient documentation

## 2023-03-23 DIAGNOSIS — I10 Essential (primary) hypertension: Secondary | ICD-10-CM | POA: Insufficient documentation

## 2023-03-23 DIAGNOSIS — Z79899 Other long term (current) drug therapy: Secondary | ICD-10-CM | POA: Diagnosis not present

## 2023-03-23 DIAGNOSIS — S0083XA Contusion of other part of head, initial encounter: Secondary | ICD-10-CM | POA: Insufficient documentation

## 2023-03-23 NOTE — Discharge Instructions (Addendum)
CAT scan today looks normal without any signs of internal bleeding or broken bones.  It is okay to do Tylenol and ibuprofen as needed for soreness.  It is okay for you to go to sleep you do not have to be woken up.

## 2023-03-23 NOTE — ED Triage Notes (Signed)
Says she was at work and fell due to slippery floors hitting head on the ground.   Swelling to left forehead.   Nurse at work encouraged coming to ED for CT scan and evaluation.

## 2023-03-23 NOTE — ED Provider Notes (Signed)
[place] EMERGENCY DEPARTMENT AT [facility] Provider Note   CSN: [id number] Arrival date & time: 03/23/23  1858     History  Chief Complaint  Patient presents with   Caitlin Day is a 62 y.o. female.  Pt is a 61y/o female with hx of HTN and presenting after a fall at work.  Patient reports that she was walking and slipped and fell and hit the left side of her face on a metal rail but does not recall hitting the floor.  She denies any loss of consciousness.  She does not take any anticoagulation.  She has not had headache, vision changes or vomiting since the event.  She has no neck pain or injury to the arms or legs.  The history is provided by the patient.  Fall       Home Medications Prior to Admission medications   Medication Sig Start Date End Date Taking? Authorizing Provider  allopurinol (ZYLOPRIM) 100 MG tablet Take 100 mg by mouth daily. 12/21/18   [provider]  estradiol (ESTRACE) 0.5 MG tablet Take 0.5 mg by mouth daily. 01/22/19   [provider]  lisinopril-hydrochlorothiazide (ZESTORETIC) 20-25 MG tablet Take 1 tablet by mouth daily. 12/21/18   [provider]  pravastatin (PRAVACHOL) 40 MG tablet Take by mouth. 06/04/18   [provider]  SYNTHROID 150 MCG tablet Take 150 mcg by mouth every morning. 02/05/19   [provider]  traMADol (ULTRAM) 50 MG tablet Take 1 tablet (50 mg total) by mouth every 8 (eight) hours as needed. 02/28/19   [name], DO      Allergies    Patient has no known allergies.    Review of Systems   Review of Systems  Physical Exam Updated Vital Signs BP (!) 141/93   Pulse 64   Temp 98.3 F (36.8 C)   Resp 16   Ht 5\' 10"  (1.778 m)   Wt 97.5 kg   SpO2 100%   BMI 30.85 kg/m  Physical Exam Vitals and nursing note reviewed.  Constitutional:      General: She is not in acute distress.    Appearance: She is well-developed.  HENT:     Head: Normocephalic.    Eyes:     Pupils: Pupils are equal, round, and reactive to light.  Cardiovascular:     Rate and Rhythm: Normal rate and regular rhythm.     Heart sounds: Normal heart sounds. No murmur heard.    No friction rub.  Pulmonary:     Effort: Pulmonary effort is normal.     Breath sounds: Normal breath sounds. No wheezing or rales.  Abdominal:     General: Bowel sounds are normal. There is no distension.     Palpations: Abdomen is soft.     Tenderness: There is no abdominal tenderness. There is no guarding or rebound.  Musculoskeletal:        General: No tenderness. Normal range of motion.     Cervical back: Normal range of motion and neck supple. No tenderness.     Right lower leg: No edema.     Left lower leg: No edema.     Comments: No edema  Skin:    General: Skin is warm and dry.     Findings: No rash.  Neurological:     Mental Status: She is alert and oriented to person, place, and time.     Cranial Nerves: No cranial nerve  deficit.  Psychiatric:        Behavior: Behavior normal.     ED Results / Procedures / Treatments   Labs (all labs ordered are listed, but only abnormal results are displayed) Labs Reviewed - No data to display  EKG None  Radiology CT Head Wo Contrast  Result Date: 03/23/2023 CLINICAL DATA:  Recent slip and fall with head injury, initial encounter EXAM: CT HEAD WITHOUT CONTRAST TECHNIQUE: Contiguous axial images were obtained from the base of the skull through the vertex without intravenous contrast. RADIATION DOSE REDUCTION: This exam was performed according to the departmental dose-optimization program which includes automated exposure control, adjustment of the mA and/or kV according to patient size and/or use of iterative reconstruction technique. COMPARISON:  None Available. FINDINGS: Brain: Mild atrophic changes are noted. No findings to suggest acute hemorrhage, acute infarction or space-occupying mass lesion noted. Vascular: No hyperdense vessel  or unexpected calcification. Skull: Normal. Negative for fracture or focal lesion. Sinuses/Orbits: Orbits and their contents are within normal limits. Opacification of the left maxillary antrum is noted. Soft tissue swelling is noted in the left supraorbital region consistent with the recent injury. Other: None IMPRESSION: No acute intracranial abnormality noted. Soft tissue swelling in the left supraorbital region consistent with the given clinical history. Electronically Signed   By: Alcide Clever M.D.   On: 03/23/2023 21:32    Procedures Procedures    Medications Ordered in ED Medications - No data to display  ED Course/ Medical Decision Making/ A&P                             Medical Decision Making Amount and/or Complexity of Data Reviewed Radiology: ordered and independent interpretation performed. Decision-making details documented in ED Course.   Patient presenting here after falling at work.  Patient does have hematoma present to the left side of the head and an abrasion to the chin but otherwise is well-appearing.  She has no neck tenderness and low suspicion for cervical injury.  Patient does not take any anticoagulation and is neurovascularly intact.  I have independently visualized and interpreted pt's images today.  Head CT negative for acute bleed.  Radiology reports no acute intracranial pathology but does show taut soft tissue swelling.  Findings discussed with the patient.  At this time appears stable for discharge home.  Was given return precautions.         Final Clinical Impression(s) / ED Diagnoses Final diagnoses:  Fall, initial encounter  Contusion of face, initial encounter    Rx / DC Orders ED Discharge Orders     None         Gwyneth Sprout, MD 03/23/23 2237

## 2023-05-09 ENCOUNTER — Encounter: Payer: Self-pay | Admitting: Nurse Practitioner

## 2023-05-09 ENCOUNTER — Ambulatory Visit: Payer: BC Managed Care – PPO | Admitting: Nurse Practitioner

## 2023-05-09 VITALS — BP 132/86 | HR 86 | Temp 97.3°F | Resp 16 | Ht 70.5 in | Wt 240.1 lb

## 2023-05-09 DIAGNOSIS — M1A9XX0 Chronic gout, unspecified, without tophus (tophi): Secondary | ICD-10-CM | POA: Insufficient documentation

## 2023-05-09 DIAGNOSIS — Z780 Asymptomatic menopausal state: Secondary | ICD-10-CM | POA: Insufficient documentation

## 2023-05-09 DIAGNOSIS — Z131 Encounter for screening for diabetes mellitus: Secondary | ICD-10-CM

## 2023-05-09 DIAGNOSIS — Z7689 Persons encountering health services in other specified circumstances: Secondary | ICD-10-CM

## 2023-05-09 DIAGNOSIS — Z130 Encounter for screening for diseases of the blood and blood-forming organs and certain disorders involving the immune mechanism: Secondary | ICD-10-CM

## 2023-05-09 DIAGNOSIS — I10 Essential (primary) hypertension: Secondary | ICD-10-CM | POA: Insufficient documentation

## 2023-05-09 DIAGNOSIS — Z1159 Encounter for screening for other viral diseases: Secondary | ICD-10-CM

## 2023-05-09 DIAGNOSIS — E039 Hypothyroidism, unspecified: Secondary | ICD-10-CM | POA: Insufficient documentation

## 2023-05-09 DIAGNOSIS — E559 Vitamin D deficiency, unspecified: Secondary | ICD-10-CM

## 2023-05-09 DIAGNOSIS — E782 Mixed hyperlipidemia: Secondary | ICD-10-CM | POA: Insufficient documentation

## 2023-05-09 DIAGNOSIS — Z114 Encounter for screening for human immunodeficiency virus [HIV]: Secondary | ICD-10-CM

## 2023-05-09 MED ORDER — SYNTHROID 150 MCG PO TABS: 150.0000 ug | ORAL_TABLET | Freq: Every day | ORAL | 1 refills | Status: DC

## 2023-05-09 MED ORDER — LISINOPRIL-HYDROCHLOROTHIAZIDE 20-25 MG PO TABS: 1.0000 | ORAL_TABLET | Freq: Every day | ORAL | 1 refills | Status: DC

## 2023-05-09 MED ORDER — EZETIMIBE 10 MG PO TABS: 10.0000 mg | ORAL_TABLET | Freq: Every day | ORAL | 1 refills | Status: DC

## 2023-05-09 MED ORDER — VITAMIN D (ERGOCALCIFEROL) 1.25 MG (50000 UNIT) PO CAPS: 50000.0000 [IU] | ORAL_CAPSULE | ORAL | 10 refills | Status: DC

## 2023-05-09 MED ORDER — PRAVASTATIN SODIUM 40 MG PO TABS: 40.0000 mg | ORAL_TABLET | Freq: Every day | ORAL | 1 refills | Status: DC

## 2023-05-09 MED ORDER — ESTRADIOL 0.5 MG PO TABS: 0.5000 mg | ORAL_TABLET | Freq: Every day | ORAL | 1 refills | Status: DC

## 2023-05-09 MED ORDER — ALLOPURINOL 100 MG PO TABS: 100.0000 mg | ORAL_TABLET | Freq: Every day | ORAL | 1 refills | Status: DC

## 2023-05-09 NOTE — Assessment & Plan Note (Signed)
Currently taking allopurinol 100 mg daily

## 2023-05-09 NOTE — Assessment & Plan Note (Signed)
Continue pravastatin 40 mg daily and zetia 10 mg daily

## 2023-05-09 NOTE — Assessment & Plan Note (Signed)
Currently on estradiol 0.5 mg daily.

## 2023-05-09 NOTE — Assessment & Plan Note (Signed)
Continue synthroid 150mcg daily

## 2023-05-09 NOTE — Assessment & Plan Note (Signed)
Continue lisinopril-hydrochlorothiazide 20-25 mg daily

## 2023-05-09 NOTE — Progress Notes (Signed)
BP 132/86   Pulse 86   Temp (!) 97.3 F (36.3 C) (Temporal)   Resp 16   Ht 5' 10.5" (1.791 m)   Wt 240 lb 1.6 oz (108.9 kg)   BMI 33.96 kg/m    Subjective:    Patient ID: Caitlin Day, female    DOB: August 02, 1961, 62 y.o.   MRN: [id number]  HPI: Caitlin Day is a 62 y.o. female  Chief Complaint  Patient presents with   Establish Care   Establish care: her last physical was a few months ago.  Medical history includes htn,hld .  Family history includes dementia.  Health maintenance recently had labs done. Will get labs at next visit.   Hypertension:  -Medications: lisinopril-hydrochlorothiazide 20-25 mg daily  -Patient is compliant with above medications and reports no side effects. -Checking BP at home (average): 120s -Denies any SOB, CP, vision changes, LE edema or symptoms of hypotension -Diet: recommend DASH diet  -Exercise: recommend 150 min of physical activity weekly     HLD:  -Medications: pravastatin 40 mg daily, zetia 10 mg daily -Patient is compliant with above medications and reports no side effects.  -Last lipid panel: will get labs today  Hypothyroidism: currently takes synthroid 150 mcg daily. Reports she is doing well, she says her recently labs showed her tsh was in normal range.    Obesity:  Current weight : 240 lbs BMI: 33.96 Treatment Tried: lifestyle modification Comorbidities: htn, hld   Gout:  currently takes allopurinol 100 mg daily. She reports she has not had an issue with gout in a long time.   Menopause: patient currently on estradiol 0.5 mg daily. Reports she is doing well. She was having hot flashes but they have improved.   Relevant past medical, surgical, family and social history reviewed and updated as indicated. Interim medical history since our last visit reviewed. Allergies and medications reviewed and updated.  Review of Systems  Constitutional: Negative for fever or weight change.  Respiratory: Negative for cough and  shortness of breath.   Cardiovascular: Negative for chest pain or palpitations.  Gastrointestinal: Negative for abdominal pain, no bowel changes.  Musculoskeletal: Negative for gait problem or joint swelling.  Skin: Negative for rash.  Neurological: Negative for dizziness or headache.  No other specific complaints in a complete review of systems (except as listed in HPI above).      Objective:    BP 132/86   Pulse 86   Temp (!) 97.3 F (36.3 C) (Temporal)   Resp 16   Ht 5' 10.5" (1.791 m)   Wt 240 lb 1.6 oz (108.9 kg)   BMI 33.96 kg/m   Wt Readings from Last 3 Encounters:  05/09/23 240 lb 1.6 oz (108.9 kg)  03/23/23 215 lb (97.5 kg)  11/27/21 230 lb (104.3 kg)    Physical Exam  Constitutional: Patient appears well-developed and well-nourished. Obese  No distress.  HEENT: head atraumatic, normocephalic, pupils equal and reactive to light, neck supple Cardiovascular: Normal rate, regular rhythm and normal heart sounds.  No murmur heard. No BLE edema. Pulmonary/Chest: Effort normal and breath sounds normal. No respiratory distress. Abdominal: Soft.  There is no tenderness. Psychiatric: Patient has a normal mood and affect. behavior is normal. Judgment and thought content normal.  No results found for this or any previous visit.    Assessment & Plan:   Problem List Items Addressed This Visit       Cardiovascular and Mediastinum   Primary hypertension - Primary  Continue lisinopril-hydrochlorothiazide 20-25 mg daily      Relevant Medications   pravastatin (PRAVACHOL) 40 MG tablet   lisinopril-hydrochlorothiazide (ZESTORETIC) 20-25 MG tablet   ezetimibe (ZETIA) 10 MG tablet   Other Relevant Orders   CBC with Differential/Platelet   COMPLETE METABOLIC PANEL WITH GFR     Endocrine   Hypothyroidism    Continue synthroid 150 mcg daily      Relevant Medications   SYNTHROID 150 MCG tablet   Other Relevant Orders   TSH     Other   Mixed hyperlipidemia    Continue  pravastatin 40 mg daily and zetia 10 mg daily      Relevant Medications   pravastatin (PRAVACHOL) 40 MG tablet   lisinopril-hydrochlorothiazide (ZESTORETIC) 20-25 MG tablet   ezetimibe (ZETIA) 10 MG tablet   Other Relevant Orders   COMPLETE METABOLIC PANEL WITH GFR   Lipid panel   Menopause    Currently on estradiol 0.5 mg daily.       Relevant Medications   estradiol (ESTRACE) 0.5 MG tablet   Chronic gout without tophus    Currently taking allopurinol 100 mg daily      Relevant Medications   allopurinol (ZYLOPRIM) 100 MG tablet   Other Visit Diagnoses     Screening for diabetes mellitus       Relevant Orders   COMPLETE METABOLIC PANEL WITH GFR   Hemoglobin A1c   Screening for deficiency anemia       Relevant Orders   CBC with Differential/Platelet   Encounter for hepatitis C screening test for low risk patient       Relevant Orders   Hepatitis C antibody   Screening for HIV without presence of risk factors       Relevant Orders   HIV Antibody (routine testing w rflx)   Encounter to establish care       schedule cpe when due   Vitamin D deficiency       sent in vitamin d supplement   Relevant Medications   Vitamin D, Ergocalciferol, (DRISDOL) 1.25 MG (50000 UNIT) CAPS capsule        Follow up plan: Return in about 6 months (around 11/06/2023) for follow up.

## 2023-06-19 DIAGNOSIS — Z1231 Encounter for screening mammogram for malignant neoplasm of breast: Secondary | ICD-10-CM | POA: Diagnosis not present

## 2023-11-06 ENCOUNTER — Ambulatory Visit (INDEPENDENT_AMBULATORY_CARE_PROVIDER_SITE_OTHER): Payer: BC Managed Care – PPO | Admitting: Nurse Practitioner

## 2023-11-06 ENCOUNTER — Encounter: Payer: Self-pay | Admitting: Nurse Practitioner

## 2023-11-06 VITALS — BP 132/84 | HR 84 | Resp 18 | Ht 70.5 in | Wt 236.4 lb

## 2023-11-06 DIAGNOSIS — Z1159 Encounter for screening for other viral diseases: Secondary | ICD-10-CM

## 2023-11-06 DIAGNOSIS — Z780 Asymptomatic menopausal state: Secondary | ICD-10-CM | POA: Diagnosis not present

## 2023-11-06 DIAGNOSIS — E6609 Other obesity due to excess calories: Secondary | ICD-10-CM

## 2023-11-06 DIAGNOSIS — E782 Mixed hyperlipidemia: Secondary | ICD-10-CM

## 2023-11-06 DIAGNOSIS — Z131 Encounter for screening for diabetes mellitus: Secondary | ICD-10-CM | POA: Diagnosis not present

## 2023-11-06 DIAGNOSIS — E66811 Obesity, class 1: Secondary | ICD-10-CM | POA: Insufficient documentation

## 2023-11-06 DIAGNOSIS — Z6833 Body mass index (BMI) 33.0-33.9, adult: Secondary | ICD-10-CM

## 2023-11-06 DIAGNOSIS — M1A09X0 Idiopathic chronic gout, multiple sites, without tophus (tophi): Secondary | ICD-10-CM

## 2023-11-06 DIAGNOSIS — I10 Essential (primary) hypertension: Secondary | ICD-10-CM

## 2023-11-06 DIAGNOSIS — E039 Hypothyroidism, unspecified: Secondary | ICD-10-CM | POA: Diagnosis not present

## 2023-11-06 DIAGNOSIS — M1A9XX0 Chronic gout, unspecified, without tophus (tophi): Secondary | ICD-10-CM

## 2023-11-06 DIAGNOSIS — E559 Vitamin D deficiency, unspecified: Secondary | ICD-10-CM

## 2023-11-06 MED ORDER — VITAMIN D (ERGOCALCIFEROL) 1.25 MG (50000 UNIT) PO CAPS: 50000.0000 [IU] | ORAL_CAPSULE | ORAL | 10 refills | Status: AC

## 2023-11-06 MED ORDER — SYNTHROID 150 MCG PO TABS: 150.0000 ug | ORAL_TABLET | Freq: Every day | ORAL | 1 refills | Status: DC

## 2023-11-06 MED ORDER — EZETIMIBE 10 MG PO TABS: 10.0000 mg | ORAL_TABLET | Freq: Every day | ORAL | 1 refills | Status: DC

## 2023-11-06 MED ORDER — LISINOPRIL-HYDROCHLOROTHIAZIDE 20-25 MG PO TABS: 1.0000 | ORAL_TABLET | Freq: Every day | ORAL | 1 refills | Status: DC

## 2023-11-06 MED ORDER — PRAVASTATIN SODIUM 40 MG PO TABS: 40.0000 mg | ORAL_TABLET | Freq: Every day | ORAL | 1 refills | Status: DC

## 2023-11-06 MED ORDER — ALLOPURINOL 100 MG PO TABS: 100.0000 mg | ORAL_TABLET | Freq: Every day | ORAL | 1 refills | Status: DC

## 2023-11-06 MED ORDER — ESTRADIOL 0.5 MG PO TABS: 0.5000 mg | ORAL_TABLET | Freq: Every day | ORAL | 1 refills | Status: DC

## 2023-11-06 NOTE — Progress Notes (Signed)
 BP 132/84   Pulse 84   Resp 18   Ht 5' 10.5" (1.791 m)   Wt 236 lb 6.4 oz (107.2 kg)   SpO2 96%   BMI 33.44 kg/m    Subjective:    Patient ID: Caitlin Day, female    DOB: Jul 17, 1961, 63 y.o.   MRN: [id number]  HPI: Caitlin Day is a 63 y.o. female  Chief Complaint  Patient presents with   Medical Management of Chronic Issues    Discussed the use of AI scribe software for clinical note transcription with the patient, who gave verbal consent to proceed.  History of Present Illness   The patient presents for a six-month follow-up visit for hypertension, hypothyroidism, hyperlipidemia, and gout.  She is on lisinopril-hydrochlorothiazide 20-25 mg daily for hypertension. Her blood pressure was noted to be 130 over an unspecified value during this visit, and she does not regularly check her blood pressure at home.  She denies any problems with her thyroid and is taking Synthroid 150 micrograms daily.  For hyperlipidemia, she is taking Zetia 10 mg daily and pravastatin 40 mg daily. Triglycerides may be elevated due to a non-fasting state during lab tests.  No recent issues with her gout and is currently taking allopurinol 100 mg daily.  She has a history of menopause and is currently taking estradiol 0.5 mg daily. She reports doing well with her current medication regimen.  She has a history of obesity, with a weight of 240 pounds and a BMI of 33.96 at her last appointment.       05/09/2023    8:14 AM  Depression screen PHQ 2/9  Decreased Interest 0  Down, Depressed, Hopeless 0  PHQ - 2 Score 0  Altered sleeping 0  Tired, decreased energy 0  Change in appetite 0  Feeling bad or failure about yourself  0  Trouble concentrating 0  Moving slowly or fidgety/restless 0  Suicidal thoughts 0  PHQ-9 Score 0  Difficult doing work/chores Not difficult at all    Relevant past medical, surgical, family and social history reviewed and updated as indicated. Interim medical  history since our last visit reviewed. Allergies and medications reviewed and updated.  Review of Systems  Constitutional: Negative for fever or weight change.  Respiratory: Negative for cough and shortness of breath.   Cardiovascular: Negative for chest pain or palpitations.  Gastrointestinal: Negative for abdominal pain, no bowel changes.  Musculoskeletal: Negative for gait problem or joint swelling.  Skin: Negative for rash.  Neurological: Negative for dizziness or headache.  No other specific complaints in a complete review of systems (except as listed in HPI above).      Objective:    BP 132/84   Pulse 84   Resp 18   Ht 5' 10.5" (1.791 m)   Wt 236 lb 6.4 oz (107.2 kg)   SpO2 96%   BMI 33.44 kg/m   BP Readings from Last 3 Encounters:  11/06/23 132/84  05/09/23 132/86  03/23/23 132/80     Wt Readings from Last 3 Encounters:  11/06/23 236 lb 6.4 oz (107.2 kg)  05/09/23 240 lb 1.6 oz (108.9 kg)  03/23/23 215 lb (97.5 kg)    Physical Exam  Results for orders placed or performed in visit on 05/09/23  [facility] MAMMOGRAPHY   Collection Time: 06/13/22 12:00 AM  Result Value Ref Range   HM Mammogram 0-4 Bi-Rad 0-4 Bi-Rad, Self Reported Normal       Assessment & Plan:  Problem List Items Addressed This Visit       Cardiovascular and Mediastinum   Primary hypertension - Primary   Relevant Medications   ezetimibe (ZETIA) 10 MG tablet   lisinopril-hydrochlorothiazide (ZESTORETIC) 20-25 MG tablet   pravastatin (PRAVACHOL) 40 MG tablet   Other Relevant Orders   CBC with Differential/Platelet   COMPLETE METABOLIC PANEL WITH GFR     Endocrine   Hypothyroidism   Relevant Medications   SYNTHROID 150 MCG tablet   Other Relevant Orders   TSH     Other   Mixed hyperlipidemia   Relevant Medications   ezetimibe (ZETIA) 10 MG tablet   lisinopril-hydrochlorothiazide (ZESTORETIC) 20-25 MG tablet   pravastatin (PRAVACHOL) 40 MG tablet   Other Relevant Orders   Lipid  panel   Menopause   Relevant Medications   estradiol (ESTRACE) 0.5 MG tablet   Chronic gout without tophus   Relevant Medications   allopurinol (ZYLOPRIM) 100 MG tablet   Class 1 obesity due to excess calories with serious comorbidity and body mass index (BMI) of 33.0 to 33.9 in adult   Other Visit Diagnoses       Screening for diabetes mellitus       Relevant Orders   COMPLETE METABOLIC PANEL WITH GFR   Hemoglobin A1c     Encounter for hepatitis C screening test for low risk patient       Relevant Orders   Hepatitis C antibody     Vitamin D deficiency       sent in vitamin d supplement   Relevant Medications   Vitamin D, Ergocalciferol, (DRISDOL) 1.25 MG (50000 UNIT) CAPS capsule        Assessment and Plan    Hypertension Blood pressure well-managed with current medication. - Send prescription refills to [facility] on [address]. - Order laboratory tests for health status and lipid levels.  Hyperlipidemia On Zetia and pravastatin.   Hypothyroidism Current medication regimen effective. - Send prescription refills   Gout No recent flare-ups. Current allopurinol regimen effective. - Send prescription refills   Menopause Estradiol effective for symptom management. - Send prescription refills   Obesity Weight 240 pounds, BMI 33.96. No specific weight management discussion.  General Health Maintenance Routine follow-up for health conditions and medication management. Eating prior to lab tests may affect triglyceride levels, not a major concern.        Follow up plan: Return in about 6 months (around 05/08/2024) for follow up.

## 2023-11-07 LAB — COMPLETE METABOLIC PANEL WITH GFR
AG Ratio: 1.5 (calc) (ref 1.0–2.5)
ALT: 36 U/L — ABNORMAL HIGH (ref 6–29)
AST: 29 U/L (ref 10–35)
Albumin: 4.5 g/dL (ref 3.6–5.1)
Alkaline phosphatase (APISO): 62 U/L (ref 37–153)
BUN: 14 mg/dL (ref 7–25)
CO2: 32 mmol/L (ref 20–32)
Calcium: 10 mg/dL (ref 8.6–10.4)
Chloride: 98 mmol/L (ref 98–110)
Creat: 0.78 mg/dL (ref 0.50–1.05)
Globulin: 3 g/dL (ref 1.9–3.7)
Glucose, Bld: 104 mg/dL — ABNORMAL HIGH (ref 65–99)
Potassium: 4.2 mmol/L (ref 3.5–5.3)
Sodium: 138 mmol/L (ref 135–146)
Total Bilirubin: 0.5 mg/dL (ref 0.2–1.2)
Total Protein: 7.5 g/dL (ref 6.1–8.1)
eGFR: 86 mL/min/{1.73_m2} (ref >=60)

## 2023-11-07 LAB — CBC WITH DIFFERENTIAL/PLATELET
Absolute Lymphocytes: 2092 {cells}/uL (ref 850–3900)
Absolute Monocytes: 396 {cells}/uL (ref 200–950)
Basophils Absolute: 22 {cells}/uL (ref 0–200)
Basophils Relative: 0.6 %
Eosinophils Absolute: 79 {cells}/uL (ref 15–500)
Eosinophils Relative: 2.2 %
HCT: 37.8 % (ref 35.0–45.0)
Hemoglobin: 12.5 g/dL (ref 11.7–15.5)
MCH: 29 pg (ref 27.0–33.0)
MCHC: 33.1 g/dL (ref 32.0–36.0)
MCV: 87.7 fL (ref 80.0–100.0)
MPV: 10.7 fL (ref 7.5–12.5)
Monocytes Relative: 11 %
Neutro Abs: 1012 {cells}/uL — ABNORMAL LOW (ref 1500–7800)
Neutrophils Relative %: 28.1 %
Platelets: 213 10*3/uL (ref 140–400)
RBC: 4.31 10*6/uL (ref 3.80–5.10)
RDW: 13.5 % (ref 11.0–15.0)
Total Lymphocyte: 58.1 %
WBC: 3.6 10*3/uL — ABNORMAL LOW (ref 3.8–10.8)

## 2023-11-07 LAB — LIPID PANEL
Cholesterol: 240 mg/dL — ABNORMAL HIGH (ref <200)
HDL: 63 mg/dL (ref >=50)
LDL Cholesterol (Calc): 154 mg/dL — ABNORMAL HIGH
Non-HDL Cholesterol (Calc): 177 mg/dL — ABNORMAL HIGH (ref <130)
Total CHOL/HDL Ratio: 3.8 (calc) (ref <5.0)
Triglycerides: 116 mg/dL (ref <150)

## 2023-11-07 LAB — HEPATITIS C ANTIBODY: Hepatitis C Ab: NONREACTIVE

## 2023-11-07 LAB — HEMOGLOBIN A1C
Hgb A1c MFr Bld: 6.2 %{Hb} — ABNORMAL HIGH (ref <5.7)
Mean Plasma Glucose: 131 mg/dL
eAG (mmol/L): 7.3 mmol/L

## 2023-11-07 LAB — TSH: TSH: 2.42 m[IU]/L (ref 0.40–4.50)

## 2024-01-17 ENCOUNTER — Encounter: Payer: Self-pay | Admitting: Nurse Practitioner

## 2024-01-17 ENCOUNTER — Ambulatory Visit (INDEPENDENT_AMBULATORY_CARE_PROVIDER_SITE_OTHER): Admitting: Nurse Practitioner

## 2024-01-17 VITALS — BP 136/82 | HR 85 | Temp 98.1°F | Ht 70.5 in | Wt 237.1 lb

## 2024-01-17 DIAGNOSIS — R21 Rash and other nonspecific skin eruption: Secondary | ICD-10-CM | POA: Diagnosis not present

## 2024-01-17 MED ORDER — TRIAMCINOLONE ACETONIDE 0.1 % EX OINT: 1.0000 | TOPICAL_OINTMENT | Freq: Two times a day (BID) | CUTANEOUS | 0 refills | Status: DC

## 2024-01-17 MED ORDER — HYDROXYZINE PAMOATE 25 MG PO CAPS: 25.0000 mg | ORAL_CAPSULE | Freq: Three times a day (TID) | ORAL | 0 refills | Status: DC | PRN

## 2024-01-17 NOTE — Progress Notes (Signed)
 BP 136/82 (BP Location: Right Arm, Patient Position: Sitting, Cuff Size: Normal)   Pulse 85   Temp 98.1 F (36.7 C) (Oral)   Ht 5' 10.5" (1.791 m)   Wt 237 lb 1.6 oz (107.5 kg)   SpO2 100%   BMI 33.54 kg/m    Subjective:    Patient ID: Caitlin Day, female    DOB: [birth year], 63 y.o.   MRN: [id number]  HPI: Caitlin Day is a 63 y.o. female  Chief Complaint  Patient presents with   skin concern    Onset 1 day, red itchy bumps on both arms, right thigh and back. Benadryl and anti itch cream not helping, patient was not sure if it was hydrocortisone she used     Discussed the use of AI scribe software for clinical note transcription with the patient, who gave verbal consent to proceed.  History of Present Illness Caitlin Day is a 63 year old female who presents with itching that started yesterday.  She describes the itching as localized to her left arm. The symptoms began yesterday and have persisted since then. No known exposure to bed bugs or other insects.  She suspects that her work environment, which has been 'kind of dirty' due to ongoing rework, might be related to her symptoms. She has been using anti-itch cream and taking Benadryl to manage the symptoms.             01/17/2024    8:54 AM 05/09/2023    8:14 AM  Depression screen PHQ 2/9  Decreased Interest 0 0  Down, Depressed, Hopeless 0 0  PHQ - 2 Score 0 0  Altered sleeping 0 0  Tired, decreased energy 0 0  Change in appetite 0 0  Feeling bad or failure about yourself  0 0  Trouble concentrating 0 0  Moving slowly or fidgety/restless 0 0  Suicidal thoughts 0 0  PHQ-9 Score 0 0  Difficult doing work/chores Not difficult at all Not difficult at all    Relevant past medical, surgical, family and social history reviewed and updated as indicated. Interim medical history since our last visit reviewed. Allergies and medications reviewed and updated.  Review of Systems  Ten systems reviewed and is  negative except as mentioned in HPI      Objective:      BP 136/82 (BP Location: Right Arm, Patient Position: Sitting, Cuff Size: Normal)   Pulse 85   Temp 98.1 F (36.7 C) (Oral)   Ht 5' 10.5" (1.791 m)   Wt 237 lb 1.6 oz (107.5 kg)   SpO2 100%   BMI 33.54 kg/m    Wt Readings from Last 3 Encounters:  01/17/24 237 lb 1.6 oz (107.5 kg)  11/06/23 236 lb 6.4 oz (107.2 kg)  05/09/23 240 lb 1.6 oz (108.9 kg)    Physical Exam Vitals reviewed.  Constitutional:      Appearance: Normal appearance.  HENT:     Head: Normocephalic.  Cardiovascular:     Rate and Rhythm: Normal rate.  Pulmonary:     Effort: Pulmonary effort is normal.  Skin:    Findings: Rash present.  Neurological:     General: No focal deficit present.     Mental Status: She is alert and oriented to person, place, and time. Mental status is at baseline.  Psychiatric:        Mood and Affect: Mood normal.        Behavior: Behavior normal.  Thought Content: Thought content normal.        Judgment: Judgment normal.       Results for orders placed or performed in visit on 11/06/23  CBC with Differential/Platelet   Collection Time: 11/06/23  9:20 AM  Result Value Ref Range   WBC 3.6 (L) 3.8 - 10.8 Thousand/uL   RBC 4.31 3.80 - 5.10 Million/uL   Hemoglobin 12.5 11.7 - 15.5 g/dL   HCT 16.1 09.6 - 04.5 %   MCV 87.7 80.0 - 100.0 fL   MCH 29.0 27.0 - 33.0 pg   MCHC 33.1 32.0 - 36.0 g/dL   RDW 40.9 81.1 - 91.4 %   Platelets 213 140 - 400 Thousand/uL   MPV 10.7 7.5 - 12.5 fL   Neutro Abs 1,012 (L) 1,500 - 7,800 cells/uL   Absolute Lymphocytes 2,092 850 - 3,900 cells/uL   Absolute Monocytes 396 200 - 950 cells/uL   Eosinophils Absolute 79 15 - 500 cells/uL   Basophils Absolute 22 0 - 200 cells/uL   Neutrophils Relative % 28.1 %   Total Lymphocyte 58.1 %   Monocytes Relative 11.0 %   Eosinophils Relative 2.2 %   Basophils Relative 0.6 %  COMPLETE METABOLIC PANEL WITH GFR   Collection Time: 11/06/23   9:20 AM  Result Value Ref Range   Glucose, Bld 104 (H) 65 - 99 mg/dL   BUN 14 7 - 25 mg/dL   Creat 7.82 9.56 - 2.13 mg/dL   eGFR 86 > OR = 60 YQ/MVH/8.46N6   BUN/Creatinine Ratio SEE NOTE: 6 - 22 (calc)   Sodium 138 135 - 146 mmol/L   Potassium 4.2 3.5 - 5.3 mmol/L   Chloride 98 98 - 110 mmol/L   CO2 32 20 - 32 mmol/L   Calcium 10.0 8.6 - 10.4 mg/dL   Total Protein 7.5 6.1 - 8.1 g/dL   Albumin 4.5 3.6 - 5.1 g/dL   Globulin 3.0 1.9 - 3.7 g/dL (calc)   AG Ratio 1.5 1.0 - 2.5 (calc)   Total Bilirubin 0.5 0.2 - 1.2 mg/dL   Alkaline phosphatase (APISO) 62 37 - 153 U/L   AST 29 10 - 35 U/L   ALT 36 (H) 6 - 29 U/L  Lipid panel   Collection Time: 11/06/23  9:20 AM  Result Value Ref Range   Cholesterol 240 (H) <200 mg/dL   HDL 63 > OR = 50 mg/dL   Triglycerides 295 <284 mg/dL   LDL Cholesterol (Calc) 154 (H) mg/dL (calc)   Total CHOL/HDL Ratio 3.8 <5.0 (calc)   Non-HDL Cholesterol (Calc) 177 (H) <130 mg/dL (calc)  TSH   Collection Time: 11/06/23  9:20 AM  Result Value Ref Range   TSH 2.42 0.40 - 4.50 mIU/L  Hemoglobin A1c   Collection Time: 11/06/23  9:20 AM  Result Value Ref Range   Hgb A1c MFr Bld 6.2 (H) <5.7 % of total Hgb   Mean Plasma Glucose 131 mg/dL   eAG (mmol/L) 7.3 mmol/L  Hepatitis C antibody   Collection Time: 11/06/23  9:20 AM  Result Value Ref Range   Hepatitis C Ab NON-REACTIVE NON-REACTIVE          Assessment & Plan:   Problem List Items Addressed This Visit   None Visit Diagnoses       Rash    -  Primary   Relevant Medications   triamcinolone ointment (KENALOG) 0.1 %   hydrOXYzine (VISTARIL) 25 MG capsule        Assessment  and Plan Assessment & Plan Pruritus due to suspected bug bites Pruritus began yesterday, localized to multiple areas, resembling bug bites. No new topical products used and no evidence of bed bugs at home. Possible exposure to irritants at work. Anti-itch cream and oral Benadryl provided insufficient relief. Discussed  potential drowsiness with hydroxyzine use during the day and advised alternative daytime antihistamines. - Prescribe kenalog cream to apply twice daily. - Discontinue Benadryl and start Zyrtec or Claritin during the day for itch relief. - Prescribe hydroxyzine at bedtime for itch relief, cautioning about potential drowsiness if taken during the day. - Advise to report if symptoms worsen or do not improve.        Follow up plan: Return if symptoms worsen or fail to improve.

## 2024-01-19 ENCOUNTER — Encounter: Payer: Self-pay | Admitting: Nurse Practitioner

## 2024-01-19 ENCOUNTER — Ambulatory Visit: Payer: Self-pay

## 2024-01-19 ENCOUNTER — Ambulatory Visit: Admitting: Nurse Practitioner

## 2024-01-19 VITALS — BP 138/88 | HR 77 | Temp 97.8°F | Ht 70.0 in | Wt 237.9 lb

## 2024-01-19 DIAGNOSIS — R21 Rash and other nonspecific skin eruption: Secondary | ICD-10-CM

## 2024-01-19 MED ORDER — DEXAMETHASONE SODIUM PHOSPHATE 10 MG/ML IJ SOLN
10.0000 mg | Freq: Once | INTRAMUSCULAR | Status: AC
  Administered 2024-01-19: 10 mg via INTRAMUSCULAR

## 2024-01-19 MED ORDER — PREDNISONE 10 MG (21) PO TBPK: ORAL_TABLET | ORAL | 0 refills | Status: DC

## 2024-01-19 NOTE — Telephone Encounter (Signed)
  Chief Complaint: Rash - spreading Symptoms: raised bumps covering 25% of her body Frequency: 2 days Pertinent Negatives: Patient denies  Disposition: [] ED /[] Urgent Care (no appt availability in office) / [x] Appointment(In office/virtual)/ []  [facility]/ [] Home Care/ [] Refused Recommended Disposition /[] [facility]/ []  Follow-up with PCP Additional Notes: Pt reports that rash is getting worse and is spreading. Rash now covers 25% of her body. Appt scheduled for this morning.    Copied from CRM [id number]. Topic: Clinical - Red Word Triage >> Jan 19, 2024  8:55 AM Baldomero Bone wrote: Red Word that prompted transfer to Nurse Triage: seen for bumps on body, cream is not working that was prescribed. spreading and itching. callback number is [phone] Reason for Disposition  Rash involves more than 20% of the body  Answer Assessment - Initial Assessment Questions 1. APPEARANCE of RASH: "Describe the rash."      Little red bumps 2. LOCATION: "Where is the rash located?"  (e.g., face, genitals, hands, legs)     Back arms and legs 3. SIZE: "How large is the rash?"      1/4 of body has rash 4. ONSET: "When did the rash begin?"      2 days ago 5. ITCHING: "Does the rash itch?" If Yes, ask: "How bad is it?"   - MILD - doesn't interfere with normal activities   - MODERATE-SEVERE: interferes with work, school, sleep, or other activities      moderate 6. EXPOSURE:  "How were you exposed to the plant (poison ivy, poison oak, sumac)"  "When were you exposed?"       A friend was  Protocols used: Poison Ivy - Oak - [facility]

## 2024-01-19 NOTE — Progress Notes (Signed)
 BP 138/88 (BP Location: Right Arm, Patient Position: Sitting, Cuff Size: Normal)   Pulse 77   Temp 97.8 F (36.6 C) (Oral)   Ht 5\' 10"  (1.778 m)   Wt 237 lb 14.4 oz (107.9 kg)   SpO2 100%   BMI 34.14 kg/m    Subjective:    Patient ID: Caitlin Day, female    DOB: 09-27-1960, 62 y.o.   MRN: [id number]  HPI: Caitlin Day is a 63 y.o. female  Chief Complaint  Patient presents with   Rash    Rash, getting worse from other day. Arms, back, right leg. Itching, calamine lotion helps itch temporarily, kenalog was not helping     Discussed the use of AI scribe software for clinical note transcription with the patient, who gave verbal consent to proceed.  History of Present Illness Caitlin Day is a 63 year old female who presents with worsening rash.  Initially seen on Jan 17, 2024, for a rash localized to her left arm, she had tried anti-itch cream and Benadryl without relief. No new soaps, detergents, or lotions were used. She was prescribed Kenalog cream to apply twice daily and advised to start Zyrtec or Claritin during the day for itch relief, and hydroxyzine at bedtime.  Since the initial visit, the rash has worsened and spread to her arms, back, and right leg. The Kenalog cream has not been effective in alleviating her symptoms. The rash on her leg has remained about the same, but the rash on her arm has significantly worsened. She continues to experience itching despite the use of allergy medication.          01/17/2024    8:54 AM 05/09/2023    8:14 AM  Depression screen PHQ 2/9  Decreased Interest 0 0  Down, Depressed, Hopeless 0 0  PHQ - 2 Score 0 0  Altered sleeping 0 0  Tired, decreased energy 0 0  Change in appetite 0 0  Feeling bad or failure about yourself  0 0  Trouble concentrating 0 0  Moving slowly or fidgety/restless 0 0  Suicidal thoughts 0 0  PHQ-9 Score 0 0  Difficult doing work/chores Not difficult at all Not difficult at all    Relevant past  medical, surgical, family and social history reviewed and updated as indicated. Interim medical history since our last visit reviewed. Allergies and medications reviewed and updated.  Review of Systems  Ten systems reviewed and is negative except as mentioned in HPI      Objective:      BP 138/88 (BP Location: Right Arm, Patient Position: Sitting, Cuff Size: Normal)   Pulse 77   Temp 97.8 F (36.6 C) (Oral)   Ht 5\' 10"  (1.778 m)   Wt 237 lb 14.4 oz (107.9 kg)   SpO2 100%   BMI 34.14 kg/m    Wt Readings from Last 3 Encounters:  01/19/24 237 lb 14.4 oz (107.9 kg)  01/17/24 237 lb 1.6 oz (107.5 kg)  11/06/23 236 lb 6.4 oz (107.2 kg)    Physical Exam Vitals reviewed.  Constitutional:      Appearance: Normal appearance.  HENT:     Head: Normocephalic.  Cardiovascular:     Rate and Rhythm: Normal rate.  Pulmonary:     Effort: Pulmonary effort is normal.  Skin:    General: Skin is warm.     Findings: Rash present. Rash is papular and urticarial.  Neurological:     General: No focal deficit present.  Mental Status: She is alert and oriented to person, place, and time. Mental status is at baseline.  Psychiatric:        Mood and Affect: Mood normal.        Behavior: Behavior normal.        Thought Content: Thought content normal.        Judgment: Judgment normal.    Physical Exam    Results for orders placed or performed in visit on 11/06/23  CBC with Differential/Platelet   Collection Time: 11/06/23  9:20 AM  Result Value Ref Range   WBC 3.6 (L) 3.8 - 10.8 Thousand/uL   RBC 4.31 3.80 - 5.10 Million/uL   Hemoglobin 12.5 11.7 - 15.5 g/dL   HCT 27.2 53.6 - 64.4 %   MCV 87.7 80.0 - 100.0 fL   MCH 29.0 27.0 - 33.0 pg   MCHC 33.1 32.0 - 36.0 g/dL   RDW 03.4 74.2 - 59.5 %   Platelets 213 140 - 400 Thousand/uL   MPV 10.7 7.5 - 12.5 fL   Neutro Abs 1,012 (L) 1,500 - 7,800 cells/uL   Absolute Lymphocytes 2,092 850 - 3,900 cells/uL   Absolute Monocytes 396 200 -  950 cells/uL   Eosinophils Absolute 79 15 - 500 cells/uL   Basophils Absolute 22 0 - 200 cells/uL   Neutrophils Relative % 28.1 %   Total Lymphocyte 58.1 %   Monocytes Relative 11.0 %   Eosinophils Relative 2.2 %   Basophils Relative 0.6 %  COMPLETE METABOLIC PANEL WITH GFR   Collection Time: 11/06/23  9:20 AM  Result Value Ref Range   Glucose, Bld 104 (H) 65 - 99 mg/dL   BUN 14 7 - 25 mg/dL   Creat 6.38 7.56 - 4.33 mg/dL   eGFR 86 > OR = 60 IR/JJO/8.41Y6   BUN/Creatinine Ratio SEE NOTE: 6 - 22 (calc)   Sodium 138 135 - 146 mmol/L   Potassium 4.2 3.5 - 5.3 mmol/L   Chloride 98 98 - 110 mmol/L   CO2 32 20 - 32 mmol/L   Calcium 10.0 8.6 - 10.4 mg/dL   Total Protein 7.5 6.1 - 8.1 g/dL   Albumin 4.5 3.6 - 5.1 g/dL   Globulin 3.0 1.9 - 3.7 g/dL (calc)   AG Ratio 1.5 1.0 - 2.5 (calc)   Total Bilirubin 0.5 0.2 - 1.2 mg/dL   Alkaline phosphatase (APISO) 62 37 - 153 U/L   AST 29 10 - 35 U/L   ALT 36 (H) 6 - 29 U/L  Lipid panel   Collection Time: 11/06/23  9:20 AM  Result Value Ref Range   Cholesterol 240 (H) <200 mg/dL   HDL 63 > OR = 50 mg/dL   Triglycerides 063 <016 mg/dL   LDL Cholesterol (Calc) 154 (H) mg/dL (calc)   Total CHOL/HDL Ratio 3.8 <5.0 (calc)   Non-HDL Cholesterol (Calc) 177 (H) <130 mg/dL (calc)  TSH   Collection Time: 11/06/23  9:20 AM  Result Value Ref Range   TSH 2.42 0.40 - 4.50 mIU/L  Hemoglobin A1c   Collection Time: 11/06/23  9:20 AM  Result Value Ref Range   Hgb A1c MFr Bld 6.2 (H) <5.7 % of total Hgb   Mean Plasma Glucose 131 mg/dL   eAG (mmol/L) 7.3 mmol/L  Hepatitis C antibody   Collection Time: 11/06/23  9:20 AM  Result Value Ref Range   Hepatitis C Ab NON-REACTIVE NON-REACTIVE          Assessment & Plan:  Problem List Items Addressed This Visit   None Visit Diagnoses       Rash    -  Primary   Relevant Medications   predniSONE (STERAPRED UNI-PAK 21 TAB) 10 MG (21) TBPK tablet   dexamethasone (DECADRON) injection 10 mg (Completed)    Other Relevant Orders   CBC with Differential/Platelet   C-reactive protein   Sedimentation rate   Ambulatory referral to Dermatology        Assessment and Plan Assessment & Plan Rash with pruritus Worsening rash initially localized to the left arm, now spreading to arms, back, and right leg. Previous treatment with Kenalog cream, anti-itch cream, and Benadryl was ineffective. Differential diagnosis includes possible dermatological conditions requiring further investigation.  Presents like bug bites but patient denies any. Informed consent obtained for steroid injection and oral steroids, with discussion of potential side effects and the need for further evaluation if no improvement. - Administer steroid injection today. - Prescribe oral steroids to start tomorrow. - Order laboratory tests to rule out other underlying conditions. - Refer to dermatology for further evaluation and possible biopsy if condition does not improve.        Follow up plan: Return if symptoms worsen or fail to improve.

## 2024-03-05 ENCOUNTER — Ambulatory Visit: Payer: Self-pay

## 2024-03-05 NOTE — Telephone Encounter (Signed)
 FYI Only or Action Required?: FYI only for provider.  Patient was last seen in primary care on 01/19/2024 by Gareth Mliss FALCON, FNP.  Called Nurse Triage reporting Back Pain.  Symptoms began several weeks ago.  Interventions attempted: OTC medications: muscle rub, Tylenol  and Ice/heat application.  Symptoms are: lower back pain radiates to buttocks gradually worsening.  Triage Disposition: See PCP When Office is Open (Within 3 Days)  Patient/caregiver understands and will follow disposition?: Yes                Copied from CRM [id number]. Topic: Clinical - Red Word Triage >> Mar 05, 2024  9:59 AM Essie A wrote: Red Word that prompted transfer to Nurse Triage: Patient is suffering from low back pain for 3 weeks. Reason for Disposition  [1] MODERATE back pain (e.g., interferes with normal activities) AND [2] present > 3 days  Answer Assessment - Initial Assessment Questions 1. ONSET: When did the pain begin?      X 3 weeks.  2. LOCATION: Where does it hurt? (upper, mid or lower back)     Lower back.  3. SEVERITY: How bad is the pain?  (e.g., Scale 1-10; mild, moderate, or severe)   - MILD (1-3): Doesn't interfere with normal activities.    - MODERATE (4-7): Interferes with normal activities or awakens from sleep.    - SEVERE (8-10): Excruciating pain, unable to do any normal activities.      8/10.  4. PATTERN: Is the pain constant? (e.g., yes, no; constant, intermittent)      Constant.  5. RADIATION: Does the pain shoot into your legs or somewhere else?     Buttocks.  6. CAUSE:  What do you think is causing the back pain?      Lifted something heavy.  7. BACK OVERUSE:  Any recent lifting of heavy objects, strenuous work or exercise?     Lifting heavy objects at work (50-80 pounds).  8. MEDICINES: What have you taken so far for the pain? (e.g., nothing, acetaminophen , NSAIDS)     Tylenol , OTC extra strength muscle rub, heating pad.  9. NEUROLOGIC  SYMPTOMS: Do you have any weakness, numbness, or problems with bowel/bladder control?     None.  10. OTHER SYMPTOMS: Do you have any other symptoms? (e.g., fever, abdomen pain, burning with urination, blood in urine)       None.  11. PREGNANCY: Is there any chance you are pregnant? When was your last menstrual period?       N/A.  Protocols used: Back Pain-A-AH

## 2024-03-06 ENCOUNTER — Encounter: Payer: Self-pay | Admitting: Nurse Practitioner

## 2024-03-06 ENCOUNTER — Ambulatory Visit: Admitting: Nurse Practitioner

## 2024-03-06 VITALS — BP 124/82 | HR 83 | Temp 98.0°F | Resp 18 | Ht 70.0 in | Wt 240.0 lb

## 2024-03-06 DIAGNOSIS — M5441 Lumbago with sciatica, right side: Secondary | ICD-10-CM | POA: Diagnosis not present

## 2024-03-06 DIAGNOSIS — M545 Low back pain, unspecified: Secondary | ICD-10-CM | POA: Diagnosis not present

## 2024-03-06 LAB — POCT URINALYSIS DIPSTICK
Bilirubin, UA: NEGATIVE
Blood, UA: NEGATIVE
Glucose, UA: NEGATIVE
Ketones, UA: NEGATIVE
Leukocytes, UA: NEGATIVE
Nitrite, UA: NEGATIVE
Odor: NORMAL
Protein, UA: NEGATIVE
Spec Grav, UA: 1.025 (ref 1.010–1.025)
Urobilinogen, UA: 0.2 U/dL
pH, UA: 6 (ref 5.0–8.0)

## 2024-03-06 MED ORDER — TIZANIDINE HCL 4 MG PO TABS: 4.0000 mg | ORAL_TABLET | Freq: Three times a day (TID) | ORAL | 0 refills | Status: AC | PRN

## 2024-03-06 MED ORDER — PREDNISONE 10 MG (21) PO TBPK: ORAL_TABLET | ORAL | 0 refills | Status: DC

## 2024-03-06 MED ORDER — TRIAMCINOLONE ACETONIDE 40 MG/ML IJ SUSP: 40.0000 mg | Freq: Once | INTRAMUSCULAR | Status: DC

## 2024-03-06 MED ORDER — TRIAMCINOLONE ACETONIDE 40 MG/ML IJ SUSP
40.0000 mg | Freq: Once | INTRAMUSCULAR | Status: AC
Start: 2024-03-06 — End: 2024-03-06
  Administered 2024-03-06: 40 mg via INTRAMUSCULAR

## 2024-03-06 MED ORDER — KETOROLAC TROMETHAMINE 60 MG/2ML IM SOLN
60.0000 mg | Freq: Once | INTRAMUSCULAR | Status: AC
  Administered 2024-03-06: 60 mg via INTRAMUSCULAR

## 2024-03-06 MED ORDER — NAPROXEN 500 MG PO TABS: 500.0000 mg | ORAL_TABLET | Freq: Two times a day (BID) | ORAL | 0 refills | Status: AC

## 2024-03-06 NOTE — Progress Notes (Addendum)
 BP 124/82   Pulse 83   Temp 98 F (36.7 C)   Resp 18   Ht 5' 10 (1.778 m)   Wt 240 lb (108.9 kg)   BMI 34.44 kg/m    Subjective:    Patient ID: Caitlin Day, female    DOB: [birth year], 63 y.o.   MRN: [id number]  HPI: Caitlin Day is a 63 y.o. female presenting today with constant lower back pain that radiates to buttocks after moving heavy equipment at work. Pain is 8/10 and has been persistent for 3 weeks. Also reports aching pain down right leg. Reports trying  tylenol , heat, and ice for pain relief, but only providing minimal relief. Patient reports she will be bringing back accomodation paperwork to be signed to limit her from heavy lifting at place of work.          01/17/2024    8:54 AM 05/09/2023    8:14 AM  Depression screen PHQ 2/9  Decreased Interest 0 0  Down, Depressed, Hopeless 0 0  PHQ - 2 Score 0 0  Altered sleeping 0 0  Tired, decreased energy 0 0  Change in appetite 0 0  Feeling bad or failure about yourself  0 0  Trouble concentrating 0 0  Moving slowly or fidgety/restless 0 0  Suicidal thoughts 0 0  PHQ-9 Score 0 0  Difficult doing work/chores Not difficult at all Not difficult at all    Relevant past medical, surgical, family and social history reviewed and updated as indicated. Interim medical history since our last visit reviewed. Allergies and medications reviewed and updated.  Review of Systems  Constitutional: Negative for fever or weight change.  Respiratory: Negative for cough and shortness of breath.   Cardiovascular: Negative for chest pain or palpitations.  Gastrointestinal: Negative for abdominal pain, no bowel changes.  Musculoskeletal: Positive for limited range of motion when bending. Reports aching pain down right leg.  Skin: Negative for rash.  Neurological: Negative for dizziness or headache.  No other specific complaints in a complete review of systems (except as listed in HPI above).      Objective:     BP 124/82    Pulse 83   Temp 98 F (36.7 C)   Resp 18   Ht 5' 10 (1.778 m)   Wt 240 lb (108.9 kg)   BMI 34.44 kg/m    Wt Readings from Last 3 Encounters:  03/06/24 240 lb (108.9 kg)  01/19/24 237 lb 14.4 oz (107.9 kg)  01/17/24 237 lb 1.6 oz (107.5 kg)    Physical Exam Constitutional:      Appearance: Normal appearance.  HENT:     Head: Normocephalic and atraumatic.  Cardiovascular:     Heart sounds: Normal heart sounds.  Musculoskeletal:     Lumbar back: Tenderness present.     Right upper leg: Tenderness present.     Comments: Tenderness from right buttock down the back of right knee       Results for orders placed or performed in visit on 03/06/24  POCT Urinalysis Dipstick   Collection Time: 03/06/24  9:40 AM  Result Value Ref Range   Color, UA yellow    Clarity, UA clear    Glucose, UA Negative Negative   Bilirubin, UA neg    Ketones, UA neg    Spec Grav, UA 1.025 1.010 - 1.025   Blood, UA neg    pH, UA 6.0 5.0 - 8.0   Protein, UA Negative Negative  Urobilinogen, UA 0.2 0.2 or 1.0 E.U./dL   Nitrite, UA neg    Leukocytes, UA Negative Negative   Appearance clear    Odor normal           Assessment & Plan:   Problem List Items Addressed This Visit   None Visit Diagnoses       Acute low back pain without sciatica, unspecified back pain laterality    -  Primary   Kenalog   and torodol injections done in office today for pain relief. Begin taking Naproxen , Tizanidine , and Oral steroids (tomorrow)   Relevant Medications   ketorolac  (TORADOL ) injection 60 mg (Completed)   triamcinolone  acetonide (KENALOG -40) injection 40 mg   Other Relevant Orders   POCT Urinalysis Dipstick (Completed)        Assessment and Plan:  -Continue with rest, heat, and ice application -Kenalog  injection and Toradol  injection administered today for pain relief -Patient to bring back accomodation paperwork d/t pt not being able to do heavy lifting at work  -Begin taking Naproxen   tonight at bedtime -Begin taking Tizanidine  every 8 hrs as needed  -Begin taking oral steroids        Follow up plan: Return if symptoms worsen or fail to improve.   I have reviewed this encounter including the documentation in this note and/or discussed this patient with the provider, Aislinn Womack, SNP, I am certifying that I agree with the content of this note as supervising/preceptor nurse practitioner.  Mliss Spray, FNP-C [facility] 03/06/2024, 10:40 AM

## 2024-03-29 ENCOUNTER — Telehealth: Payer: Self-pay

## 2024-03-29 NOTE — Telephone Encounter (Signed)
 Pt came into the office requesting an extension on her work note that was wrote on 03/06/24, to be extended for another month. I explained to her that Mliss Spray was out of the office and that I wasn't sure if another physician would write this note for her. Pt is expected to be at work today at 3 and needs this note in order to stay out of work. Please advise pt . [phone].

## 2024-03-29 NOTE — Telephone Encounter (Signed)
 Left message for patient that it will be Monday when Mliss returns

## 2024-03-29 NOTE — Telephone Encounter (Signed)
 Left message for patient that it will be Monday when Caitlin Day returns

## 2024-03-29 NOTE — Telephone Encounter (Signed)
 Was seen in 03/06/2024 do you want her to schedule an appointment for [facility] and note?

## 2024-03-29 NOTE — Telephone Encounter (Signed)
 Pt states she needs a new letter saying she still cannot do over 10lbs lifting for at least for a month. Please advice if letter can be written for that amount of time.

## 2024-03-29 NOTE — Telephone Encounter (Unsigned)
 Copied from CRM [id number]. Topic: General - Other >> Mar 29, 2024  8:51 AM Donna BRAVO wrote: Reason for CRM: patient calling, [employer] paper work has been faxed. Patient requires a note for employer  for lifting work restrictions, patient is willing to make an appt if that is necessary.  Patient would like a call back regarding this note.   Patient was made aware their call will be returned in 1 business day

## 2024-03-29 NOTE — Telephone Encounter (Signed)
 Work note written until 04/29/2024 patient did not answer left detailed vm and letter ready for up front.

## 2024-05-08 ENCOUNTER — Ambulatory Visit: Admitting: Nurse Practitioner

## 2024-05-08 ENCOUNTER — Encounter: Payer: Self-pay | Admitting: Nurse Practitioner

## 2024-05-08 VITALS — BP 116/78 | HR 84 | Temp 98.3°F | Resp 18 | Ht 70.0 in | Wt 241.4 lb

## 2024-05-08 DIAGNOSIS — R21 Rash and other nonspecific skin eruption: Secondary | ICD-10-CM

## 2024-05-08 DIAGNOSIS — M1A09X0 Idiopathic chronic gout, multiple sites, without tophus (tophi): Secondary | ICD-10-CM

## 2024-05-08 DIAGNOSIS — E782 Mixed hyperlipidemia: Secondary | ICD-10-CM

## 2024-05-08 DIAGNOSIS — E66811 Obesity, class 1: Secondary | ICD-10-CM

## 2024-05-08 DIAGNOSIS — E039 Hypothyroidism, unspecified: Secondary | ICD-10-CM | POA: Diagnosis not present

## 2024-05-08 DIAGNOSIS — Z131 Encounter for screening for diabetes mellitus: Secondary | ICD-10-CM | POA: Diagnosis not present

## 2024-05-08 DIAGNOSIS — Z780 Asymptomatic menopausal state: Secondary | ICD-10-CM

## 2024-05-08 DIAGNOSIS — Z6833 Body mass index (BMI) 33.0-33.9, adult: Secondary | ICD-10-CM

## 2024-05-08 DIAGNOSIS — M1A9XX0 Chronic gout, unspecified, without tophus (tophi): Secondary | ICD-10-CM

## 2024-05-08 DIAGNOSIS — E6609 Other obesity due to excess calories: Secondary | ICD-10-CM

## 2024-05-08 DIAGNOSIS — E559 Vitamin D deficiency, unspecified: Secondary | ICD-10-CM | POA: Diagnosis not present

## 2024-05-08 DIAGNOSIS — I10 Essential (primary) hypertension: Secondary | ICD-10-CM

## 2024-05-08 MED ORDER — ALLOPURINOL 100 MG PO TABS: 100.0000 mg | ORAL_TABLET | Freq: Every day | ORAL | 1 refills | Status: AC

## 2024-05-08 MED ORDER — LISINOPRIL-HYDROCHLOROTHIAZIDE 20-25 MG PO TABS: 1.0000 | ORAL_TABLET | Freq: Every day | ORAL | 1 refills | Status: AC

## 2024-05-08 MED ORDER — PRAVASTATIN SODIUM 40 MG PO TABS: 40.0000 mg | ORAL_TABLET | Freq: Every day | ORAL | 1 refills | Status: DC

## 2024-05-08 MED ORDER — HYDROXYZINE PAMOATE 25 MG PO CAPS
25.0000 mg | ORAL_CAPSULE | Freq: Three times a day (TID) | ORAL | 0 refills | Status: AC | PRN
Start: 2024-05-08 — End: ?

## 2024-05-08 MED ORDER — ESTRADIOL 0.5 MG PO TABS: 0.5000 mg | ORAL_TABLET | Freq: Every day | ORAL | 1 refills | Status: AC

## 2024-05-08 NOTE — Assessment & Plan Note (Signed)
 Tolerating medication well and denies side effects. Last BP within normal range. Takes Lisinopril -hydrochlorothiazide  20-25 mg. Will check labs.

## 2024-05-08 NOTE — Progress Notes (Signed)
 BP 116/78   Pulse 84   Temp 98.3 F (36.8 C)   Resp 18   Ht 5' 10 (1.778 m)   Wt 241 lb 6.4 oz (109.5 kg)   SpO2 98%   BMI 34.64 kg/m    Subjective:    Patient ID: Caitlin Day, female    DOB: [birth year], 63 y.o.   MRN: [id number]  HPI: Caitlin Day is a 63 y.o. female  Chief Complaint  Patient presents with   Medical Management of Chronic Issues   The patient presents for a six-month follow-up visit for hypertension, hypothyroidism, hyperlipidemia, and gout. She has a history of menopause and obesity.  Hypertension: -Medications: Lisinopril -hydrochlorothiazide  20-25 mg -Patient is compliant with above medications and reports no side effects. -Checking BP at home (average): Does not check blood pressure at home. -Denies any SOB, CP, vision changes, LE edema or symptoms of hypotension -Diet: Regular diet -Exercise: Does not exercise, but works 3 jobs where she is constantly standing.  HLD: -Medications: Pravastatin  40 mg, Ezetimibe  10 mg -Patient is compliant with above medications and reports no side effects.  -Last lipid panel: Total cholesterol and LDL were elevated on 11/06/23; will recheck during this visit. -Patient requests if she can only take one medication for hyperlipidemia, instead of two. Discussed about discontinuing Ezetimibe  and only taking Pravastatin . Will check lipid panel and decide at next visit if the Pravastatin  dose needs to be increased.  Hypothyroidism: -Medications: Synthroid  150 mcg -Patient is compliant with above medications and reports no side effects. -Denies fatigue, weight gain, dry skin, or constipation. -Last TSH: 2.42 (11/06/23); will recheck during this visit.  Gout: -Medications: Allopurinol  100 mg -Patient is compliant with above medications and reports no side effects. -Denies pain, swelling, or tenderness in joints.  Menopause: -Medications: Estradiol  0.5 mg -Patient is compliant with above medications and reports no  side effects. -She reports she is doing well with her current regimen.  Obesity: -BMI: 34.64 -Weight: 109.5 kg   PHQ-9 is negative for depression.    05/08/2024    8:12 AM 01/17/2024    8:54 AM 05/09/2023    8:14 AM  Depression screen PHQ 2/9  Decreased Interest 0 0 0  Down, Depressed, Hopeless 0 0 0  PHQ - 2 Score 0 0 0  Altered sleeping 0 0 0  Tired, decreased energy 0 0 0  Change in appetite 0 0 0  Feeling bad or failure about yourself  0 0 0  Trouble concentrating 0 0 0  Moving slowly or fidgety/restless 0 0 0  Suicidal thoughts 0 0 0  PHQ-9 Score 0 0 0  Difficult doing work/chores Not difficult at all Not difficult at all Not difficult at all    Relevant past medical, surgical, family and social history reviewed and updated as indicated. Interim medical history since our last visit reviewed. Allergies and medications reviewed and updated.  Review of Systems  Constitutional: Negative for fever or weight change.  Respiratory: Negative for cough and shortness of breath.   Cardiovascular: Negative for chest pain or palpitations.  Gastrointestinal: Negative for abdominal pain, no bowel changes.  Musculoskeletal: Negative for gait problem or joint swelling.  Skin: Negative for rash.  Neurological: Negative for dizziness or headache.  No other specific complaints in a complete review of systems (except as listed in HPI above).     Objective:     BP 116/78   Pulse 84   Temp 98.3 F (36.8 C)   Resp 18  Ht 5' 10 (1.778 m)   Wt 241 lb 6.4 oz (109.5 kg)   SpO2 98%   BMI 34.64 kg/m    Wt Readings from Last 3 Encounters:  05/08/24 241 lb 6.4 oz (109.5 kg)  03/06/24 240 lb (108.9 kg)  01/19/24 237 lb 14.4 oz (107.9 kg)    Physical Exam Constitutional:      Appearance: Normal appearance. She is obese.  Cardiovascular:     Rate and Rhythm: Normal rate and regular rhythm.     Heart sounds: Normal heart sounds.  Pulmonary:     Effort: Pulmonary effort is normal.      Breath sounds: Normal breath sounds.  Abdominal:     General: Bowel sounds are normal.     Palpations: Abdomen is soft.  Musculoskeletal:     Cervical back: Normal range of motion and neck supple.  Skin:    General: Skin is warm and dry.     Capillary Refill: Capillary refill takes less than 2 seconds.  Neurological:     General: No focal deficit present.     Mental Status: She is alert and oriented to person, place, and time. Mental status is at baseline.  Psychiatric:        Mood and Affect: Mood normal.        Behavior: Behavior normal.        Thought Content: Thought content normal.        Judgment: Judgment normal.     Results for orders placed or performed in visit on 03/06/24  POCT Urinalysis Dipstick   Collection Time: 03/06/24  9:40 AM  Result Value Ref Range   Color, UA yellow    Clarity, UA clear    Glucose, UA Negative Negative   Bilirubin, UA neg    Ketones, UA neg    Spec Grav, UA 1.025 1.010 - 1.025   Blood, UA neg    pH, UA 6.0 5.0 - 8.0   Protein, UA Negative Negative   Urobilinogen, UA 0.2 0.2 or 1.0 E.U./dL   Nitrite, UA neg    Leukocytes, UA Negative Negative   Appearance clear    Odor normal           Assessment & Plan:   Problem List Items Addressed This Visit       Cardiovascular and Mediastinum   Primary hypertension - Primary   Tolerating medication well and denies side effects. Last BP within normal range. Takes Lisinopril -hydrochlorothiazide  20-25 mg. Will check labs.      Relevant Medications   lisinopril -hydrochlorothiazide  (ZESTORETIC ) 20-25 MG tablet   pravastatin  (PRAVACHOL ) 40 MG tablet   Other Relevant Orders   CBC with Differential/Platelet   Comprehensive metabolic panel with GFR     Endocrine   Hypothyroidism   Takes Synthroid  150 mcg. Tolerating well and no side effects. Will check TSH.      Relevant Orders   TSH     Other   Mixed hyperlipidemia   Takes Pravastatin  40 mg and Ezetimibe  10 mg. Will d/c  Ezetimibe  and check lipid panel. May need to increase Pravastatin  dose at next visit.      Relevant Medications   lisinopril -hydrochlorothiazide  (ZESTORETIC ) 20-25 MG tablet   pravastatin  (PRAVACHOL ) 40 MG tablet   Other Relevant Orders   Comprehensive metabolic panel with GFR   Lipid panel   Menopause   Takes Estradiol  0.5 mg. Tolerating well and denies any side effects.      Relevant Medications   estradiol  (ESTRACE ) 0.5  MG tablet   Chronic gout without tophus   Takes Allopurinol  100 mg. Tolerating well and denies any side effects. No recent flare-ups.      Relevant Medications   allopurinol  (ZYLOPRIM ) 100 MG tablet   Class 1 obesity due to excess calories with serious comorbidity and body mass index (BMI) of 33.0 to 33.9 in adult   Educated on importance of healthy eating and exercise. Will check labs.      Relevant Orders   TSH   Other Visit Diagnoses       Screening for diabetes mellitus       Relevant Orders   Hemoglobin A1c     Rash       Relevant Medications   hydrOXYzine  (VISTARIL ) 25 MG capsule     Vitamin D  deficiency       Relevant Orders   VITAMIN D  25 Hydroxy (Vit-D Deficiency, Fractures)          Follow up plan: Return in about 6 months (around 11/05/2024) for follow up.     I have reviewed this encounter including the documentation in this note and/or discussed this patient with the provider, Alexa Everhart SNP, I am certifying that I agree with the content of this note as supervising/preceptor nurse practitioner.  Mliss Spray, FNP-C [facility] 05/08/2024, 9:56 AM

## 2024-05-08 NOTE — Assessment & Plan Note (Signed)
 Takes Synthroid  150 mcg. Tolerating well and no side effects. Will check TSH.

## 2024-05-08 NOTE — Assessment & Plan Note (Signed)
 Takes Allopurinol  100 mg. Tolerating well and denies any side effects. No recent flare-ups.

## 2024-05-08 NOTE — Assessment & Plan Note (Signed)
 Educated on importance of healthy eating and exercise. Will check labs.

## 2024-05-08 NOTE — Assessment & Plan Note (Signed)
 Takes Pravastatin  40 mg and Ezetimibe  10 mg. Will d/c Ezetimibe  and check lipid panel. May need to increase Pravastatin  dose at next visit.

## 2024-05-08 NOTE — Assessment & Plan Note (Signed)
 Takes Estradiol  0.5 mg. Tolerating well and denies any side effects.

## 2024-05-09 ENCOUNTER — Ambulatory Visit: Payer: Self-pay | Admitting: Nurse Practitioner

## 2024-05-09 DIAGNOSIS — E782 Mixed hyperlipidemia: Secondary | ICD-10-CM

## 2024-05-09 LAB — CBC WITH DIFFERENTIAL/PLATELET
Absolute Lymphocytes: 1797 {cells}/uL (ref 850–3900)
Absolute Monocytes: 547 {cells}/uL (ref 200–950)
Basophils Absolute: 19 {cells}/uL (ref 0–200)
Basophils Relative: 0.5 %
Eosinophils Absolute: 72 {cells}/uL (ref 15–500)
Eosinophils Relative: 1.9 %
HCT: 38.3 % (ref 35.0–45.0)
Hemoglobin: 13 g/dL (ref 11.7–15.5)
MCH: 30.7 pg (ref 27.0–33.0)
MCHC: 33.9 g/dL (ref 32.0–36.0)
MCV: 90.3 fL (ref 80.0–100.0)
MPV: 10.4 fL (ref 7.5–12.5)
Monocytes Relative: 14.4 %
Neutro Abs: 1364 {cells}/uL — ABNORMAL LOW (ref 1500–7800)
Neutrophils Relative %: 35.9 %
Platelets: 220 Thousand/uL (ref 140–400)
RBC: 4.24 Million/uL (ref 3.80–5.10)
RDW: 13.6 % (ref 11.0–15.0)
Total Lymphocyte: 47.3 %
WBC: 3.8 Thousand/uL (ref 3.8–10.8)

## 2024-05-09 LAB — COMPREHENSIVE METABOLIC PANEL WITH GFR
AG Ratio: 1.6 (calc) (ref 1.0–2.5)
ALT: 38 U/L — ABNORMAL HIGH (ref 6–29)
AST: 33 U/L (ref 10–35)
Albumin: 4.4 g/dL (ref 3.6–5.1)
Alkaline phosphatase (APISO): 55 U/L (ref 37–153)
BUN: 14 mg/dL (ref 7–25)
CO2: 32 mmol/L (ref 20–32)
Calcium: 9.6 mg/dL (ref 8.6–10.4)
Chloride: 102 mmol/L (ref 98–110)
Creat: 0.87 mg/dL (ref 0.50–1.05)
Globulin: 2.7 g/dL (ref 1.9–3.7)
Glucose, Bld: 111 mg/dL — ABNORMAL HIGH (ref 65–99)
Potassium: 4.7 mmol/L (ref 3.5–5.3)
Sodium: 140 mmol/L (ref 135–146)
Total Bilirubin: 0.4 mg/dL (ref 0.2–1.2)
Total Protein: 7.1 g/dL (ref 6.1–8.1)
eGFR: 75 mL/min/1.73m2 (ref >=60)

## 2024-05-09 LAB — LIPID PANEL
Cholesterol: 205 mg/dL — ABNORMAL HIGH (ref <200)
HDL: 61 mg/dL (ref >=50)
LDL Cholesterol (Calc): 128 mg/dL — ABNORMAL HIGH
Non-HDL Cholesterol (Calc): 144 mg/dL — ABNORMAL HIGH (ref <130)
Total CHOL/HDL Ratio: 3.4 (calc) (ref <5.0)
Triglycerides: 67 mg/dL (ref <150)

## 2024-05-09 LAB — HEMOGLOBIN A1C
Hgb A1c MFr Bld: 6.1 % — ABNORMAL HIGH (ref <5.7)
Mean Plasma Glucose: 128 mg/dL
eAG (mmol/L): 7.1 mmol/L

## 2024-05-09 LAB — TSH: TSH: 1.85 m[IU]/L (ref 0.40–4.50)

## 2024-05-09 LAB — VITAMIN D 25 HYDROXY (VIT D DEFICIENCY, FRACTURES): Vit D, 25-Hydroxy: 86 ng/mL (ref 30–100)

## 2024-05-09 MED ORDER — PRAVASTATIN SODIUM 80 MG PO TABS: 80.0000 mg | ORAL_TABLET | Freq: Every day | ORAL | 1 refills | Status: AC

## 2024-06-08 ENCOUNTER — Other Ambulatory Visit: Payer: Self-pay | Admitting: Nurse Practitioner

## 2024-06-08 DIAGNOSIS — E039 Hypothyroidism, unspecified: Secondary | ICD-10-CM

## 2024-06-11 NOTE — Telephone Encounter (Signed)
 Requested Prescriptions  Pending Prescriptions Disp Refills   SYNTHROID  150 MCG tablet [Pharmacy Med Name: Synthroid  150 MCG Oral Tablet] 90 tablet 3    Sig: TAKE 1 TABLET BY MOUTH ONCE DAILY BEFORE BREAKFAST     Endocrinology:  Hypothyroid Agents Passed - 06/11/2024  1:37 PM      Passed - TSH in normal range and within 360 days    TSH  Date Value Ref Range Status  05/08/2024 1.85 0.40 - 4.50 mIU/L Final         Passed - Valid encounter within last 12 months    Recent Outpatient Visits           1 month ago Primary hypertension   [facility] Gareth Clarity F, FNP   3 months ago Acute bilateral low back pain with right-sided sciatica   [facility] Gareth Clarity FALCON, FNP   4 months ago Rash   [facility] Gareth Clarity FALCON, FNP   4 months ago Rash   [facility] Gareth Clarity FALCON, FNP   7 months ago Primary hypertension   [facility] Gareth Clarity FALCON, FNP       Future Appointments             In 4 months Gareth, Clarity FALCON, FNP [facility] [place]

## 2024-06-19 DIAGNOSIS — Z1231 Encounter for screening mammogram for malignant neoplasm of breast: Secondary | ICD-10-CM | POA: Diagnosis not present

## 2024-11-05 ENCOUNTER — Ambulatory Visit: Admitting: Nurse Practitioner

## 2024-11-06 ENCOUNTER — Ambulatory Visit: Admitting: Nurse Practitioner
